# Patient Record
Sex: Female | Born: 1965 | Race: White | Hispanic: No | Marital: Single | Smoking: Former smoker
Health system: Southern US, Community
[De-identification: ages and names within clinical notes are randomized; demographics above are authoritative.]

## PROBLEM LIST (undated history)

## (undated) DIAGNOSIS — J45909 Unspecified asthma, uncomplicated: Secondary | ICD-10-CM

## (undated) DIAGNOSIS — E785 Hyperlipidemia, unspecified: Secondary | ICD-10-CM

## (undated) DIAGNOSIS — G4733 Obstructive sleep apnea (adult) (pediatric): Secondary | ICD-10-CM

## (undated) DIAGNOSIS — E119 Type 2 diabetes mellitus without complications: Secondary | ICD-10-CM

## (undated) DIAGNOSIS — N189 Chronic kidney disease, unspecified: Secondary | ICD-10-CM

## (undated) HISTORY — DX: Hyperlipidemia, unspecified: E78.5

## (undated) HISTORY — DX: Obstructive sleep apnea (adult) (pediatric): G47.33

## (undated) HISTORY — DX: Type 2 diabetes mellitus without complications: E11.9

## (undated) HISTORY — DX: Chronic kidney disease, unspecified: N18.9

## (undated) HISTORY — DX: Unspecified asthma, uncomplicated: J45.909

---

## 2020-04-27 DIAGNOSIS — E785 Hyperlipidemia, unspecified: Secondary | ICD-10-CM | POA: Diagnosis not present

## 2020-04-27 DIAGNOSIS — R600 Localized edema: Secondary | ICD-10-CM | POA: Diagnosis not present

## 2020-04-27 DIAGNOSIS — E1165 Type 2 diabetes mellitus with hyperglycemia: Secondary | ICD-10-CM | POA: Diagnosis not present

## 2020-04-27 DIAGNOSIS — I10 Essential (primary) hypertension: Secondary | ICD-10-CM | POA: Diagnosis not present

## 2020-05-08 DIAGNOSIS — M25561 Pain in right knee: Secondary | ICD-10-CM | POA: Diagnosis not present

## 2020-05-08 DIAGNOSIS — M25562 Pain in left knee: Secondary | ICD-10-CM | POA: Diagnosis not present

## 2020-05-08 DIAGNOSIS — G8929 Other chronic pain: Secondary | ICD-10-CM | POA: Diagnosis not present

## 2020-05-29 DIAGNOSIS — R600 Localized edema: Secondary | ICD-10-CM | POA: Diagnosis not present

## 2020-05-29 DIAGNOSIS — E1165 Type 2 diabetes mellitus with hyperglycemia: Secondary | ICD-10-CM | POA: Diagnosis not present

## 2020-05-29 DIAGNOSIS — E785 Hyperlipidemia, unspecified: Secondary | ICD-10-CM | POA: Diagnosis not present

## 2020-05-29 DIAGNOSIS — I10 Essential (primary) hypertension: Secondary | ICD-10-CM | POA: Diagnosis not present

## 2020-05-29 DIAGNOSIS — N1832 Chronic kidney disease, stage 3b: Secondary | ICD-10-CM | POA: Diagnosis not present

## 2020-05-29 DIAGNOSIS — R109 Unspecified abdominal pain: Secondary | ICD-10-CM | POA: Diagnosis not present

## 2020-06-05 DIAGNOSIS — G8929 Other chronic pain: Secondary | ICD-10-CM | POA: Diagnosis not present

## 2020-06-05 DIAGNOSIS — M25561 Pain in right knee: Secondary | ICD-10-CM | POA: Diagnosis not present

## 2020-06-05 DIAGNOSIS — M25562 Pain in left knee: Secondary | ICD-10-CM | POA: Diagnosis not present

## 2020-06-19 ENCOUNTER — Encounter: Payer: Self-pay | Admitting: Endocrinology

## 2020-06-19 ENCOUNTER — Other Ambulatory Visit: Payer: Self-pay

## 2020-06-19 ENCOUNTER — Ambulatory Visit (INDEPENDENT_AMBULATORY_CARE_PROVIDER_SITE_OTHER): Payer: BC Managed Care – PPO | Admitting: Endocrinology

## 2020-06-19 VITALS — BP 136/62 | HR 74 | Ht 66.5 in | Wt 342.6 lb

## 2020-06-19 DIAGNOSIS — N184 Chronic kidney disease, stage 4 (severe): Secondary | ICD-10-CM | POA: Diagnosis not present

## 2020-06-19 DIAGNOSIS — E1122 Type 2 diabetes mellitus with diabetic chronic kidney disease: Secondary | ICD-10-CM | POA: Diagnosis not present

## 2020-06-19 DIAGNOSIS — E1369 Other specified diabetes mellitus with other specified complication: Secondary | ICD-10-CM

## 2020-06-19 LAB — POCT GLYCOSYLATED HEMOGLOBIN (HGB A1C): Hemoglobin A1C: 5.8 % — AB (ref 4.0–5.6)

## 2020-06-19 MED ORDER — GLIMEPIRIDE 4 MG PO TABS: 4.0000 mg | ORAL_TABLET | Freq: Every day | ORAL | 3 refills | Status: DC

## 2020-06-19 NOTE — Progress Notes (Signed)
Subjective:    Patient ID: Laura Kennedy, female    DOB: 1965-10-25, 55 y.o.   MRN: [id number]  HPI pt is referred by Benjiman Core, PA, for diabetes.  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  She takes [place] 25/d, and glimepiride 4-BID.  She cannot afford to continue [place].  She says cbg varies from 45-500.  It is in general highest when she misses meds.   No past medical history on file.   Social History   Socioeconomic History  . Marital status: Single    Spouse name: Not on file  . Number of children: Not on file  . Years of education: Not on file  . Highest education level: Not on file  Occupational History  . Not on file  Tobacco Use  . Smoking status: Not on file  . Smokeless tobacco: Not on file  Substance and Sexual Activity  . Alcohol use: Not on file  . Drug use: Not on file  . Sexual activity: Not on file  Other Topics Concern  . Not on file  Social History Narrative  . Not on file   Social Determinants of Health   Financial Resource Strain: Not on file  Food Insecurity: Not on file  Transportation Needs: Not on file  Physical Activity: Not on file  Stress: Not on file  Social Connections: Not on file  Intimate Partner Violence: Not on file    No current outpatient medications on file prior to visit.   No current facility-administered medications on file prior to visit.    Not on File  Family History  Problem Relation Age of Onset  . Diabetes Neg Hx     BP 136/62 (BP Location: Right Arm, Patient Position: Sitting, Cuff Size: Large)   Pulse 74   Ht 5' 6.5" (1.689 m)   Wt (!) 342 lb 9.6 oz (155.4 kg)   SpO2 93%   BMI 54.47 kg/m     Review of Systems denies blurry vision, chest pain, n/v, urinary frequency, and depression. She has  lost weight--intentional.  She has doe.      Objective:   Physical Exam VITAL SIGNS:  See vs page GENERAL: no distress Pulses: dorsalis pedis intact bilat.   MSK: no deformity of the feet CV: 2+ bilat leg edema Skin:  no ulcer on the feet.  normal color and temp on the feet. Neuro: sensation is intact to touch on the feet, but decreased from normal.    Lab Results  Component Value Date   HGBA1C 5.8 (A) 06/19/2020   outside test results are reviewed:  Creat=1.7  I have reviewed outside records, and summarized: Pt was noted to have elevated A1c, and referred here.  Dyslipidemia, arthralgias, COPD, and HTN were also addressed.       Assessment & Plan:  Type 2 DM, with stage 4 CRI: overcontrolled SDOH: she cannot afford Januvia  Patient Instructions  good diet and exercise significantly improve the control of your diabetes.  please let me know if you wish to be referred to a dietician.  high blood sugar is very risky to your health.  you should see an eye doctor and dentist every year.  It is very important to get all recommended vaccinations.  Controlling  your blood pressure and cholesterol drastically reduces the damage diabetes does to your body.  Those who smoke should quit.  Please discuss these with your doctor.  check your blood sugar once a day.  vary the time of day when you check, between before the 3 meals, and at bedtime.  also check if you have symptoms of your blood sugar being too high or too low.  please keep a record of the readings and bring it to your next appointment here (or you can bring the meter itself).  You can write it on any piece of paper.  please call us sooner if your blood sugar goes below 70, or if most of your readings are over 200. We will need to take this complex situation in stages.  For now, please:  stop taking the Januvia, and:  Reduce the glimepiride to 4 mg in the morning, and none in the evening.   Please come back for a follow-up appointment  in 2 months.

## 2020-06-19 NOTE — Patient Instructions (Addendum)
good diet and exercise significantly improve the control of your diabetes.  please let me know if you wish to be referred to a dietician.  high blood sugar is very risky to your health.  you should see an eye doctor and dentist every year.  It is very important to get all recommended vaccinations.  Controlling your blood pressure and cholesterol drastically reduces the damage diabetes does to your body.  Those who smoke should quit.  Please discuss these with your doctor.  check your blood sugar once a day.  vary the time of day when you check, between before the 3 meals, and at bedtime.  also check if you have symptoms of your blood sugar being too high or too low.  please keep a record of the readings and bring it to your next appointment here (or you can bring the meter itself).  You can write it on any piece of paper.  please call us sooner if your blood sugar goes below 70, or if most of your readings are over 200. We will need to take this complex situation in stages.  For now, please:  stop taking the Januvia, and:  Reduce the glimepiride to 4 mg in the morning, and none in the evening.   Please come back for a follow-up appointment in 2 months.

## 2020-06-20 DIAGNOSIS — E119 Type 2 diabetes mellitus without complications: Secondary | ICD-10-CM | POA: Insufficient documentation

## 2020-06-20 DIAGNOSIS — E1165 Type 2 diabetes mellitus with hyperglycemia: Secondary | ICD-10-CM | POA: Insufficient documentation

## 2020-06-24 DIAGNOSIS — N2581 Secondary hyperparathyroidism of renal origin: Secondary | ICD-10-CM | POA: Diagnosis not present

## 2020-06-24 DIAGNOSIS — I129 Hypertensive chronic kidney disease with stage 1 through stage 4 chronic kidney disease, or unspecified chronic kidney disease: Secondary | ICD-10-CM | POA: Diagnosis not present

## 2020-06-24 DIAGNOSIS — N184 Chronic kidney disease, stage 4 (severe): Secondary | ICD-10-CM | POA: Diagnosis not present

## 2020-06-24 DIAGNOSIS — D631 Anemia in chronic kidney disease: Secondary | ICD-10-CM | POA: Diagnosis not present

## 2020-06-25 ENCOUNTER — Other Ambulatory Visit: Payer: Self-pay | Admitting: Nephrology

## 2020-06-25 DIAGNOSIS — N184 Chronic kidney disease, stage 4 (severe): Secondary | ICD-10-CM

## 2020-07-10 ENCOUNTER — Ambulatory Visit
Admission: RE | Admit: 2020-07-10 | Discharge: 2020-07-10 | Disposition: A | Discharge status: Home / Self Care | Payer: BC Managed Care – PPO | Source: Ambulatory Visit | Attending: Nephrology | Admitting: Nephrology

## 2020-07-10 ENCOUNTER — Other Ambulatory Visit: Payer: Self-pay

## 2020-07-10 DIAGNOSIS — N189 Chronic kidney disease, unspecified: Secondary | ICD-10-CM | POA: Diagnosis not present

## 2020-07-10 DIAGNOSIS — R222 Localized swelling, mass and lump, trunk: Secondary | ICD-10-CM | POA: Diagnosis not present

## 2020-07-10 DIAGNOSIS — N184 Chronic kidney disease, stage 4 (severe): Secondary | ICD-10-CM

## 2020-07-10 DIAGNOSIS — K8020 Calculus of gallbladder without cholecystitis without obstruction: Secondary | ICD-10-CM | POA: Diagnosis not present

## 2020-07-10 DIAGNOSIS — K8080 Other cholelithiasis without obstruction: Secondary | ICD-10-CM | POA: Diagnosis not present

## 2020-07-17 DIAGNOSIS — E1165 Type 2 diabetes mellitus with hyperglycemia: Secondary | ICD-10-CM | POA: Diagnosis not present

## 2020-07-17 DIAGNOSIS — M25561 Pain in right knee: Secondary | ICD-10-CM | POA: Diagnosis not present

## 2020-07-17 DIAGNOSIS — I10 Essential (primary) hypertension: Secondary | ICD-10-CM | POA: Diagnosis not present

## 2020-07-17 DIAGNOSIS — M25562 Pain in left knee: Secondary | ICD-10-CM | POA: Diagnosis not present

## 2020-08-14 DIAGNOSIS — D631 Anemia in chronic kidney disease: Secondary | ICD-10-CM | POA: Diagnosis not present

## 2020-08-14 DIAGNOSIS — N2581 Secondary hyperparathyroidism of renal origin: Secondary | ICD-10-CM | POA: Diagnosis not present

## 2020-08-14 DIAGNOSIS — N183 Chronic kidney disease, stage 3 unspecified: Secondary | ICD-10-CM | POA: Diagnosis not present

## 2020-08-14 DIAGNOSIS — N184 Chronic kidney disease, stage 4 (severe): Secondary | ICD-10-CM | POA: Diagnosis not present

## 2020-08-14 DIAGNOSIS — I129 Hypertensive chronic kidney disease with stage 1 through stage 4 chronic kidney disease, or unspecified chronic kidney disease: Secondary | ICD-10-CM | POA: Diagnosis not present

## 2020-08-17 ENCOUNTER — Other Ambulatory Visit: Payer: Self-pay

## 2020-08-17 ENCOUNTER — Ambulatory Visit (INDEPENDENT_AMBULATORY_CARE_PROVIDER_SITE_OTHER): Payer: BC Managed Care – PPO | Admitting: Endocrinology

## 2020-08-17 VITALS — BP 130/76 | HR 83 | Ht 66.5 in | Wt 324.0 lb

## 2020-08-17 DIAGNOSIS — R609 Edema, unspecified: Secondary | ICD-10-CM | POA: Insufficient documentation

## 2020-08-17 DIAGNOSIS — E1122 Type 2 diabetes mellitus with diabetic chronic kidney disease: Secondary | ICD-10-CM

## 2020-08-17 DIAGNOSIS — N184 Chronic kidney disease, stage 4 (severe): Secondary | ICD-10-CM | POA: Diagnosis not present

## 2020-08-17 LAB — POCT GLYCOSYLATED HEMOGLOBIN (HGB A1C): Hemoglobin A1C: 6.9 % — AB (ref 4.0–5.6)

## 2020-08-17 LAB — BRAIN NATRIURETIC PEPTIDE: Pro B Natriuretic peptide (BNP): 57 pg/mL (ref 0.0–100.0)

## 2020-08-17 NOTE — Progress Notes (Signed)
Subjective:    Patient ID: Laura Kennedy, female    DOB: 11/20/1965, 55 y.o.   MRN: [id number]  HPI Pt returns for f/u of diabetes mellitus: DM type: 2 Dx'ed: 123XX123 Complications: PN and stage 4 CRI Therapy: glimepiride GDM: never (G1P0) DKA: never Severe hypoglycemia: never Pancreatitis: never Pancreatic imaging: never SDOH: she cannot afford name brand meds Other: she did not tolerate pioglitazone (edema); she has never been on insulin Interval history: Despite increasing glimepiride to 4-BID, cbg's are in the 200's.    Social History   Socioeconomic History  . Marital status: Single    Spouse name: Not on file  . Number of children: Not on file  . Years of education: Not on file  . Highest education level: Not on file  Occupational History  . Not on file  Tobacco Use  . Smoking status: Not on file  . Smokeless tobacco: Not on file  Substance and Sexual Activity  . Alcohol use: Not on file  . Drug use: Not on file  . Sexual activity: Not on file  Other Topics Concern  . Not on file  Social History Narrative  . Not on file   Social Determinants of Health   Financial Resource Strain: Not on file  Food Insecurity: Not on file  Transportation Needs: Not on file  Physical Activity: Not on file  Stress: Not on file  Social Connections: Not on file  Intimate Partner Violence: Not on file    Current Outpatient Medications on File Prior to Visit  Medication Sig Dispense Refill  . glimepiride (AMARYL) 4 MG tablet Take 1 tablet (4 mg total) by mouth daily before breakfast. 30 tablet 3   No current facility-administered medications on file prior to visit.    No Known Allergies  Family History  Problem Relation Age of Onset  . Diabetes Neg Hx     BP 130/76 (BP Location: Right Arm, Patient Position: Sitting, Cuff Size: Large)   Pulse 83   Ht 5' 6.5" (1.689 m)   Wt (!) 324 lb (147 kg)   SpO2 96%   BMI 51.51 kg/m   Review of Systems She has lost  weight--intentional.     Objective:   Physical Exam VITAL SIGNS:  See vs page GENERAL: no distress Pulses: dorsalis pedis intact bilat.   MSK: no deformity of the feet CV: 2+ bilat leg edema Skin:  no ulcer on the feet.  normal color and temp on the feet. Neuro: sensation is intact to touch on the feet, but decreased from normal. Ext: there is bilateral onychomycosis of the toenails.    A1c=6.9%     Assessment & Plan:  Type 2 DM: uncontrolled.  CRI: check fructosamine.      Patient Instructions   check your blood sugar once a day.  vary the time of day when you check, between before the 3 meals, and at bedtime.  also check if you have symptoms of your blood sugar being too high or too low.  please keep a record of the readings and bring it to your next appointment here (or you can bring the meter itself).  You can write it on any piece of paper.  please call us sooner if your blood sugar goes below 70, or if most of your readings are over 200. Blood tests are requested for you today.  We'll let you know about the results.  If this test is high, we can add colesevelam, acarbose, or bromocriptine.  Still, you may need insulin to get your a1c good We will need to take this complex situation in stages.   Please come back for a follow-up appointment in 2 months.     Fructosamine Test Why am I having this test? The fructosamine test is a blood test to measure your average blood sugar (glucose) level over a period of 2-3 weeks. You may have this test to help determine if recent changes in your diabetes medicines are working to control your glucose level. This test is similar to the more commonly used hemoglobin A1C test. What is being tested? This test checks the amount of fructosamine in your blood. Fructosamine is a substance that forms when glucose combines with protein in the blood. Your fructosamine level is a good indicator of your average blood glucose levels. What kind of sample  is taken? A blood sample is required for this test. It is usually collected by inserting a needle into a blood vessel.   Tell a health care provider about:  All medicines you are taking, including vitamins, herbs, eye drops, creams, and over-the-counter medicines.  Any medical conditions you have.  Whether you are pregnant or may be pregnant. How are the results reported? Your test results will be reported as a value that tells you how much fructosamine is in your blood. This will be given as millimoles of fructosamine per liter of blood (mmol/L). Your health care provider will compare your results to normal ranges that were established after testing a large group of people (reference ranges). Reference ranges may vary among labs and hospitals. For this test, a common reference range may be 205-285 mmol/L. What do the results mean? If your result is within the reference range, then your average blood glucose level is in the normal range. If your result is higher than the reference range, then your average blood glucose level is too high. This means that you and your health care provider may need to adjust your treatment plan. Talk with your health care provider about what your results mean. Questions to ask your health care provider Ask your health care provider, or the department that is doing the test:  When will my results be ready?  How will I get my results?  What are my treatment options?  What other tests do I need?  What are my next steps? Summary  Fructosamine is a substance that forms when glucose combines with protein in the blood.  The fructosamine test is a blood test to measure your average glucose level over a period of 2-3 weeks.  The fructosamine test is similar to the more commonly used hemoglobin A1C test.  A result higher than the reference range means that your average blood glucose level is too high. Talk with your health care provider about what your results  mean. This information is not intended to replace advice given to you by your health care provider. Make sure you discuss any questions you have with your health care provider. Document Revised: 12/11/2019 Document Reviewed: 12/11/2019 Elsevier Patient Education  2021 [employer].

## 2020-08-17 NOTE — Patient Instructions (Addendum)
check your blood sugar once a day.  vary the time of day when you check, between before the 3 meals, and at bedtime.  also check if you have symptoms of your blood sugar being too high or too low.  please keep a record of the readings and bring it to your next appointment here (or you can bring the meter itself).  You can write it on any piece of paper.  please call us sooner if your blood sugar goes below 70, or if most of your readings are over 200. Blood tests are requested for you today.  We'll let you know about the results.  If this test is high, we can add colesevelam, acarbose, or bromocriptine.  Still, you may need insulin to get your a1c good We will need to take this complex situation in stages.   Please come back for a follow-up appointment in 2 months.     Fructosamine Test Why am I having this test? The fructosamine test is a blood test to measure your average blood sugar (glucose) level over a period of 2-3 weeks. You may have this test to help determine if recent changes in your diabetes medicines are working to control your glucose level. This test is similar to the more commonly used hemoglobin A1C test. What is being tested? This test checks the amount of fructosamine in your blood. Fructosamine is a substance that forms when glucose combines with protein in the blood. Your fructosamine level is a good indicator of your average blood glucose levels. What kind of sample is taken? A blood sample is required for this test. It is usually collected by inserting a needle into a blood vessel.   Tell a health care provider about:  All medicines you are taking, including vitamins, herbs, eye drops, creams, and over-the-counter medicines.  Any medical conditions you have.  Whether you are pregnant or may be pregnant. How are the results reported? Your test results will be reported as a value that tells you how much fructosamine is in your blood. This will be given as millimoles of  fructosamine per liter of blood (mmol/L). Your health care provider will compare your results to normal ranges that were established after testing a large group of people (reference ranges). Reference ranges may vary among labs and hospitals. For this test, a common reference range may be 205-285 mmol/L. What do the results mean? If your result is within the reference range, then your average blood glucose level is in the normal range. If your result is higher than the reference range, then your average blood glucose level is too high. This means that you and your health care provider may need to adjust your treatment plan. Talk with your health care provider about what your results mean. Questions to ask your health care provider Ask your health care provider, or the department that is doing the test:  When will my results be ready?  How will I get my results?  What are my treatment options?  What other tests do I need?  What are my next steps? Summary  Fructosamine is a substance that forms when glucose combines with protein in the blood.  The fructosamine test is a blood test to measure your average glucose level over a period of 2-3 weeks.  The fructosamine test is similar to the more commonly used hemoglobin A1C test.  A result higher than the reference range means that your average blood glucose level is too high. Talk with  your health care provider about what your results mean. This information is not intended to replace advice given to you by your health care provider. Make sure you discuss any questions you have with your health care provider. Document Revised: 12/11/2019 Document Reviewed: 12/11/2019 Elsevier Patient Education  2021 [employer].

## 2020-08-18 ENCOUNTER — Other Ambulatory Visit: Payer: Self-pay | Admitting: Nephrology

## 2020-08-18 DIAGNOSIS — N183 Chronic kidney disease, stage 3 unspecified: Secondary | ICD-10-CM

## 2020-08-18 LAB — BASIC METABOLIC PANEL
BUN: 22 mg/dL (ref 6–23)
CO2: 24 mEq/L (ref 19–32)
Calcium: 9.4 mg/dL (ref 8.4–10.5)
Chloride: 102 mEq/L (ref 96–112)
Creatinine, Ser: 1.23 mg/dL — ABNORMAL HIGH (ref 0.40–1.20)
GFR: 49.25 mL/min — ABNORMAL LOW (ref >60.00)
Glucose, Bld: 220 mg/dL — ABNORMAL HIGH (ref 70–99)
Potassium: 4 mEq/L (ref 3.5–5.1)
Sodium: 140 mEq/L (ref 135–145)

## 2020-08-19 LAB — FRUCTOSAMINE: Fructosamine: 271 umol/L (ref 205–285)

## 2020-09-02 ENCOUNTER — Ambulatory Visit
Admission: RE | Admit: 2020-09-02 | Discharge: 2020-09-02 | Disposition: A | Discharge status: Home / Self Care | Payer: BC Managed Care – PPO | Source: Ambulatory Visit | Attending: Nephrology | Admitting: Nephrology

## 2020-09-02 ENCOUNTER — Other Ambulatory Visit: Payer: Self-pay

## 2020-09-02 DIAGNOSIS — K8020 Calculus of gallbladder without cholecystitis without obstruction: Secondary | ICD-10-CM | POA: Diagnosis not present

## 2020-09-02 DIAGNOSIS — K760 Fatty (change of) liver, not elsewhere classified: Secondary | ICD-10-CM | POA: Diagnosis not present

## 2020-09-02 DIAGNOSIS — N2889 Other specified disorders of kidney and ureter: Secondary | ICD-10-CM | POA: Diagnosis not present

## 2020-09-02 DIAGNOSIS — N183 Chronic kidney disease, stage 3 unspecified: Secondary | ICD-10-CM

## 2020-09-02 MED ORDER — GADOBENATE DIMEGLUMINE 529 MG/ML IV SOLN
20.0000 mL | Freq: Once | INTRAVENOUS | Status: AC | PRN
  Administered 2020-09-02: 20 mL via INTRAVENOUS

## 2020-10-21 ENCOUNTER — Ambulatory Visit
Admission: RE | Admit: 2020-10-21 | Discharge: 2020-10-21 | Disposition: A | Discharge status: Home / Self Care | Payer: BC Managed Care – PPO | Source: Ambulatory Visit | Attending: Sports Medicine | Admitting: Sports Medicine

## 2020-10-21 ENCOUNTER — Other Ambulatory Visit: Payer: Self-pay | Admitting: Sports Medicine

## 2020-10-21 DIAGNOSIS — M25562 Pain in left knee: Secondary | ICD-10-CM

## 2020-10-21 DIAGNOSIS — M25561 Pain in right knee: Secondary | ICD-10-CM | POA: Diagnosis not present

## 2020-11-09 DIAGNOSIS — M170 Bilateral primary osteoarthritis of knee: Secondary | ICD-10-CM | POA: Diagnosis not present

## 2020-11-20 ENCOUNTER — Ambulatory Visit: Payer: BC Managed Care – PPO | Admitting: Endocrinology

## 2020-12-16 DIAGNOSIS — I129 Hypertensive chronic kidney disease with stage 1 through stage 4 chronic kidney disease, or unspecified chronic kidney disease: Secondary | ICD-10-CM | POA: Diagnosis not present

## 2020-12-16 DIAGNOSIS — D631 Anemia in chronic kidney disease: Secondary | ICD-10-CM | POA: Diagnosis not present

## 2020-12-16 DIAGNOSIS — N183 Chronic kidney disease, stage 3 unspecified: Secondary | ICD-10-CM | POA: Diagnosis not present

## 2020-12-16 DIAGNOSIS — R109 Unspecified abdominal pain: Secondary | ICD-10-CM | POA: Diagnosis not present

## 2021-01-22 DIAGNOSIS — E1122 Type 2 diabetes mellitus with diabetic chronic kidney disease: Secondary | ICD-10-CM | POA: Diagnosis not present

## 2021-01-22 DIAGNOSIS — I10 Essential (primary) hypertension: Secondary | ICD-10-CM | POA: Diagnosis not present

## 2021-01-22 DIAGNOSIS — J449 Chronic obstructive pulmonary disease, unspecified: Secondary | ICD-10-CM | POA: Diagnosis not present

## 2021-01-22 DIAGNOSIS — E785 Hyperlipidemia, unspecified: Secondary | ICD-10-CM | POA: Diagnosis not present

## 2021-01-27 ENCOUNTER — Other Ambulatory Visit: Payer: Self-pay

## 2021-01-27 ENCOUNTER — Ambulatory Visit (INDEPENDENT_AMBULATORY_CARE_PROVIDER_SITE_OTHER): Payer: BC Managed Care – PPO | Admitting: Endocrinology

## 2021-01-27 VITALS — BP 140/80 | HR 81 | Ht 66.5 in | Wt 312.2 lb

## 2021-01-27 DIAGNOSIS — E1122 Type 2 diabetes mellitus with diabetic chronic kidney disease: Secondary | ICD-10-CM | POA: Diagnosis not present

## 2021-01-27 DIAGNOSIS — N184 Chronic kidney disease, stage 4 (severe): Secondary | ICD-10-CM

## 2021-01-27 DIAGNOSIS — M170 Bilateral primary osteoarthritis of knee: Secondary | ICD-10-CM | POA: Diagnosis not present

## 2021-01-27 MED ORDER — RYBELSUS 3 MG PO TABS: 3.0000 mg | ORAL_TABLET | Freq: Every day | ORAL | 11 refills | Status: DC

## 2021-01-27 NOTE — Patient Instructions (Signed)
check your blood sugar once a day.  vary the time of day when you check, between before the 3 meals, and at bedtime.  also check if you have symptoms of your blood sugar being too high or too low.  please keep a record of the readings and bring it to your next appointment here (or you can bring the meter itself).  You can write it on any piece of paper.  please call us sooner if your blood sugar goes below 70, or if most of your readings are over 200.  I have sent a prescription to your pharmacy, to add Rybelsus.   Please continue the same glimepiride. Please call or message [place] next week, to tell us how the blood sugar is doing.  I hope we can then increase the Rybelsus.    Please come back for a follow-up appointment in January.

## 2021-01-27 NOTE — Progress Notes (Signed)
   Subjective:    Patient ID: Laura Kennedy, female    DOB: 1966-03-20, 55 y.o.   MRN: [id number]  HPI Pt returns for f/u of diabetes mellitus: DM type: 2 Dx'ed: 6440 Complications: PN and stage 4 CRI Therapy: glimepiride GDM: never (G1P0) DKA: never Severe hypoglycemia: never Pancreatitis: never Pancreatic imaging: never SDOH: she prefers generic rx; she uses Reli-on meter, as it is cheaper than insurance.  Other: she did not tolerate pioglitazone (edema); she has never been on insulin; fructosamine has confirmed A1c.   Interval history: she has not recently checked cbg.  No recent steroids.    Social History   Socioeconomic History   Marital status: Single    Spouse name: Not on file   Number of children: Not on file   Years of education: Not on file   Highest education level: Not on file  Occupational History   Not on file  Tobacco Use   Smoking status: Not on file   Smokeless tobacco: Not on file  Substance and Sexual Activity   Alcohol use: Not on file   Drug use: Not on file   Sexual activity: Not on file  Other Topics Concern   Not on file  Social History Narrative   Not on file   Social Determinants of Health   Financial Resource Strain: Not on file  Food Insecurity: Not on file  Transportation Needs: Not on file  Physical Activity: Not on file  Stress: Not on file  Social Connections: Not on file  Intimate Partner Violence: Not on file    Current Outpatient Medications on File Prior to Visit  Medication Sig Dispense Refill   glimepiride (AMARYL) 4 MG tablet Take 1 tablet (4 mg total) by mouth daily before breakfast. 30 tablet 3   No current facility-administered medications on file prior to visit.    No Known Allergies  Family History  Problem Relation Age of Onset   Diabetes Neg Hx     BP 140/80 (BP Location: Right Arm, Patient Position: Sitting, Cuff Size: Large)   Pulse 81   Ht 5' 6.5" (1.689 m)   Wt (!) 312 lb 3.2 oz (141.6 kg)   SpO2 96%    BMI 49.64 kg/m    Review of Systems GERD is well-controlled on Nexium.      Objective:   Physical Exam    outside test results are reviewed: A1c=10.6%    Assessment & Plan:  Type 2 DM: uncontrolled  Patient Instructions  check your blood sugar once a day.  vary the time of day when you check, between before the 3 meals, and at bedtime.  also check if you have symptoms of your blood sugar being too high or too low.  please keep a record of the readings and bring it to your next appointment here (or you can bring the meter itself).  You can write it on any piece of paper.  please call us sooner if your blood sugar goes below 70, or if most of your readings are over 200.  I have sent a prescription to your pharmacy, to add Rybelsus.   Please continue the same glimepiride. Please call or message [place] next week, to tell us how the blood sugar is doing.  I hope we can then increase the Rybelsus.    Please come back for a follow-up appointment in January.

## 2021-02-02 ENCOUNTER — Telehealth: Payer: Self-pay | Admitting: Endocrinology

## 2021-02-02 ENCOUNTER — Other Ambulatory Visit: Payer: Self-pay | Admitting: Endocrinology

## 2021-02-02 MED ORDER — RYBELSUS 7 MG PO TABS: 7.0000 mg | ORAL_TABLET | Freq: Every day | ORAL | 3 refills | Status: DC

## 2021-02-02 NOTE — Telephone Encounter (Signed)
Message sent thru MyChart 

## 2021-02-02 NOTE — Telephone Encounter (Signed)
Pt giving an update that she's tolerating Rybelsus. Her sugars are trending down from 300s to 200s. She's interested in going up to 7mg s. Will do 2-3mg s with the prescription she has now instead of wasting them, if that's okay.  Pt contact [phone]

## 2021-04-21 ENCOUNTER — Other Ambulatory Visit: Payer: Self-pay

## 2021-04-21 ENCOUNTER — Ambulatory Visit (INDEPENDENT_AMBULATORY_CARE_PROVIDER_SITE_OTHER): Payer: BC Managed Care – PPO | Admitting: Endocrinology

## 2021-04-21 VITALS — BP 124/78 | HR 98 | Ht 66.5 in | Wt 311.6 lb

## 2021-04-21 DIAGNOSIS — E1122 Type 2 diabetes mellitus with diabetic chronic kidney disease: Secondary | ICD-10-CM | POA: Diagnosis not present

## 2021-04-21 DIAGNOSIS — N184 Chronic kidney disease, stage 4 (severe): Secondary | ICD-10-CM | POA: Diagnosis not present

## 2021-04-21 LAB — POCT GLYCOSYLATED HEMOGLOBIN (HGB A1C): Hemoglobin A1C: 8 % — AB (ref 4.0–5.6)

## 2021-04-21 MED ORDER — OZEMPIC (2 MG/DOSE) 8 MG/3ML ~~LOC~~ SOPN: 2.0000 mg | PEN_INJECTOR | SUBCUTANEOUS | 3 refills | Status: DC

## 2021-04-21 NOTE — Patient Instructions (Addendum)
check your blood sugar once a day.  vary the time of day when you check, between before the 3 meals, and at bedtime.  also check if you have symptoms of your blood sugar being too high or too low.  please keep a record of the readings and bring it to your next appointment here (or you can bring the meter itself).  You can write it on any piece of paper.  please call us sooner if your blood sugar goes below 70, or if most of your readings are over 200.   I have sent a prescription to your pharmacy, to change Rybelsus to [place].   If the insurance declines this, we'll change to Victoza.   Please continue the same glimepiride.     Please come back for a follow-up appointment in 2 months.

## 2021-04-21 NOTE — Progress Notes (Signed)
° °  Subjective:    Patient ID: Laura Kennedy, female    DOB: 1966-02-23, 56 y.o.   MRN: [id number]  HPI Pt returns for f/u of diabetes mellitus:  DM type: 2 Dx'ed: 5945 Complications: PN and stage 4 CRI Therapy: glimepiride GDM: never (G1P0) DKA: never Severe hypoglycemia: never Pancreatitis: never Pancreatic imaging: never SDOH: she uses Reli-on meter, as it is cheaper than insurance;  Other: she did not tolerate pioglitazone (edema); she has never been on insulin; fructosamine has confirmed A1c.   Interval history: next steroid injection (left knee) is next week.  Ins no longer covers Rybelsus.  Pt says cbg varies from 120-250.    Social History   Socioeconomic History   Marital status: Single    Spouse name: Not on file   Number of children: Not on file   Years of education: Not on file   Highest education level: Not on file  Occupational History   Not on file  Tobacco Use   Smoking status: Not on file   Smokeless tobacco: Not on file  Substance and Sexual Activity   Alcohol use: Not on file   Drug use: Not on file   Sexual activity: Not on file  Other Topics Concern   Not on file  Social History Narrative   Not on file   Social Determinants of Health   Financial Resource Strain: Not on file  Food Insecurity: Not on file  Transportation Needs: Not on file  Physical Activity: Not on file  Stress: Not on file  Social Connections: Not on file  Intimate Partner Violence: Not on file    Current Outpatient Medications on File Prior to Visit  Medication Sig Dispense Refill   glimepiride (AMARYL) 4 MG tablet Take 1 tablet (4 mg total) by mouth daily before breakfast. 30 tablet 3   No current facility-administered medications on file prior to visit.    No Known Allergies  Family History  Problem Relation Age of Onset   Diabetes Neg Hx     BP 124/78    Pulse 98    Ht 5' 6.5" (1.689 m)    Wt (!) 311 lb 9.6 oz (141.3 kg)    SpO2 96%    BMI 49.54 kg/m   Review of  Systems She denies hypoglycemia/N/V.  GERD is well-controlled on OTC Nexium.      Objective:   Physical Exam    Lab Results  Component Value Date   HGBA1C 8.0 (A) 04/21/2021      Assessment & Plan:  Type 2 DM: uncontrolled  Patient Instructions  check your blood sugar once a day.  vary the time of day when you check, between before the 3 meals, and at bedtime.  also check if you have symptoms of your blood sugar being too high or too low.  please keep a record of the readings and bring it to your next appointment here (or you can bring the meter itself).  You can write it on any piece of paper.  please call us sooner if your blood sugar goes below 70, or if most of your readings are over 200.   I have sent a prescription to your pharmacy, to change Rybelsus to [place].   If the insurance declines this, we'll change to Victoza.   Please continue the same glimepiride.     Please come back for a follow-up appointment in 2 months.

## 2021-04-28 DIAGNOSIS — M170 Bilateral primary osteoarthritis of knee: Secondary | ICD-10-CM | POA: Diagnosis not present

## 2021-04-29 ENCOUNTER — Other Ambulatory Visit: Payer: Self-pay

## 2021-04-29 ENCOUNTER — Ambulatory Visit
Admission: RE | Admit: 2021-04-29 | Discharge: 2021-04-29 | Disposition: A | Discharge status: Home / Self Care | Payer: BC Managed Care – PPO | Source: Ambulatory Visit | Attending: Family Medicine | Admitting: Family Medicine

## 2021-04-29 ENCOUNTER — Other Ambulatory Visit: Payer: Self-pay | Admitting: Family Medicine

## 2021-04-29 DIAGNOSIS — Z111 Encounter for screening for respiratory tuberculosis: Secondary | ICD-10-CM | POA: Diagnosis not present

## 2021-04-29 DIAGNOSIS — H2511 Age-related nuclear cataract, right eye: Secondary | ICD-10-CM | POA: Diagnosis not present

## 2021-04-29 DIAGNOSIS — H5213 Myopia, bilateral: Secondary | ICD-10-CM | POA: Diagnosis not present

## 2021-04-29 DIAGNOSIS — E119 Type 2 diabetes mellitus without complications: Secondary | ICD-10-CM | POA: Diagnosis not present

## 2021-05-05 DIAGNOSIS — E785 Hyperlipidemia, unspecified: Secondary | ICD-10-CM | POA: Diagnosis not present

## 2021-05-19 DIAGNOSIS — E1165 Type 2 diabetes mellitus with hyperglycemia: Secondary | ICD-10-CM | POA: Diagnosis not present

## 2021-05-19 DIAGNOSIS — Z6841 Body Mass Index (BMI) 40.0 and over, adult: Secondary | ICD-10-CM | POA: Diagnosis not present

## 2021-06-11 DIAGNOSIS — M25562 Pain in left knee: Secondary | ICD-10-CM | POA: Diagnosis not present

## 2021-06-18 DIAGNOSIS — M25562 Pain in left knee: Secondary | ICD-10-CM | POA: Diagnosis not present

## 2021-06-23 ENCOUNTER — Ambulatory Visit (INDEPENDENT_AMBULATORY_CARE_PROVIDER_SITE_OTHER): Payer: BC Managed Care – PPO | Admitting: Endocrinology

## 2021-06-23 VITALS — BP 114/76 | HR 83 | Ht 66.5 in | Wt 298.6 lb

## 2021-06-23 DIAGNOSIS — E1122 Type 2 diabetes mellitus with diabetic chronic kidney disease: Secondary | ICD-10-CM | POA: Diagnosis not present

## 2021-06-23 DIAGNOSIS — N183 Chronic kidney disease, stage 3 unspecified: Secondary | ICD-10-CM | POA: Diagnosis not present

## 2021-06-23 DIAGNOSIS — N184 Chronic kidney disease, stage 4 (severe): Secondary | ICD-10-CM | POA: Diagnosis not present

## 2021-06-23 DIAGNOSIS — N2581 Secondary hyperparathyroidism of renal origin: Secondary | ICD-10-CM | POA: Diagnosis not present

## 2021-06-23 DIAGNOSIS — I129 Hypertensive chronic kidney disease with stage 1 through stage 4 chronic kidney disease, or unspecified chronic kidney disease: Secondary | ICD-10-CM | POA: Diagnosis not present

## 2021-06-23 DIAGNOSIS — D631 Anemia in chronic kidney disease: Secondary | ICD-10-CM | POA: Diagnosis not present

## 2021-06-23 LAB — POCT GLYCOSYLATED HEMOGLOBIN (HGB A1C): Hemoglobin A1C: 6.5 % — AB (ref 4.0–5.6)

## 2021-06-23 MED ORDER — OZEMPIC (2 MG/DOSE) 8 MG/3ML ~~LOC~~ SOPN: 2.0000 mg | PEN_INJECTOR | SUBCUTANEOUS | 3 refills | Status: DC

## 2021-06-23 MED ORDER — GLIMEPIRIDE 2 MG PO TABS: 2.0000 mg | ORAL_TABLET | Freq: Every day | ORAL | 3 refills | Status: DC

## 2021-06-23 NOTE — Patient Instructions (Addendum)
check your blood sugar once a day.  vary the time of day when you check, between before the 3 meals, and at bedtime.  also check if you have symptoms of your blood sugar being too high or too low.  please keep a record of the readings and bring it to your next appointment here (or you can bring the meter itself).  You can write it on any piece of paper.  please call us sooner if your blood sugar goes below 70, or if most of your readings are over 200.    ?I have sent a prescription to your pharmacy, to half the glimepiride ?Please continue the same Ozempic.  ?Please come back for a follow-up appointment in 3 months.   ?

## 2021-06-23 NOTE — Progress Notes (Signed)
? ?  Subjective:  ? ? Patient ID: Laura Kennedy, female    DOB: Jun 10, 1965, 56 y.o.   MRN: [id number] ? ?HPI ?Pt returns for f/u of diabetes mellitus:  ?DM type: 2 ?Dx'ed: 2004 ?Complications: PN and stage 3 CRI.   ?Therapy: glimepiride and Ozempic.   ?GDM: never (G1P0) ?DKA: never ?Severe hypoglycemia: never ?Pancreatitis: never ?Pancreatic imaging: never ?SDOH: she uses Rel[employer] meter, as it is cheaper than insurance;  ?Other: she did not tolerate pioglitazone (edema); she has never been on insulin; fructosamine has confirmed A1c.   ?Interval history: pt says cbg varies from 96-240.  HB has recurred.   ? ?Social History  ? ?Socioeconomic History  ? Marital status: Single  ?  Spouse name: Not on file  ? Number of children: Not on file  ? Years of education: Not on file  ? Highest education level: Not on file  ?Occupational History  ? Not on file  ?Tobacco Use  ? Smoking status: Not on file  ? Smokeless tobacco: Not on file  ?Substance and Sexual Activity  ? Alcohol use: Not on file  ? Drug use: Not on file  ? Sexual activity: Not on file  ?Other Topics Concern  ? Not on file  ?Social History Narrative  ? Not on file  ? ?Social Determinants of Health  ? ?Financial Resource Strain: Not on file  ?Food Insecurity: Not on file  ?Transportation Needs: Not on file  ?Physical Activity: Not on file  ?Stress: Not on file  ?Social Connections: Not on file  ?Intimate Partner Violence: Not on file  ? ? ?No current outpatient medications on file prior to visit.  ? ?No current facility-administered medications on file prior to visit.  ? ? ?No Known Allergies ? ?Family History  ?Problem Relation Age of Onset  ? Diabetes Neg Hx   ? ? ?BP 114/76 (BP Location: Left Arm, Patient Position: Sitting, Cuff Size: Normal)   Pulse 83   Ht 5' 6.5" (1.689 m)   Wt 298 lb 9.6 oz (135.4 kg)   SpO2 98%   BMI 47.47 kg/m?  ? ? ?Review of Systems ?She denies hypoglycemia ?   ?Objective:  ? Physical Exam ?VITAL SIGNS:  See vs page.   ?GENERAL: no  distress.  ? ? ?Lab Results  ?Component Value Date  ? CREATININE 1.23 (H) 08/17/2020  ? BUN 22 08/17/2020  ? NA 140 08/17/2020  ? K 4.0 08/17/2020  ? CL 102 08/17/2020  ? CO2 24 08/17/2020  ? ?A1c=6.5% ?   ?Assessment & Plan:  ?Type 2 DM: overcontrolled, for this SU-containing regimen.   ?HB, due to Ozempic: we discussed.  Pt wants to continue for now.  ? ?Patient Instructions  ?check your blood sugar once a day.  vary the time of day when you check, between before the 3 meals, and at bedtime.  also check if you have symptoms of your blood sugar being too high or too low.  please keep a record of the readings and bring it to your next appointment here (or you can bring the meter itself).  You can write it on any piece of paper.  please call us sooner if your blood sugar goes below 70, or if most of your readings are over 200.    ?I have sent a prescription to your pharmacy, to half the glimepiride ?Please continue the same Ozempic.  ?Please come back for a follow-up appointment in 3 months.   ? ? ?

## 2021-07-05 DIAGNOSIS — J189 Pneumonia, unspecified organism: Secondary | ICD-10-CM | POA: Diagnosis not present

## 2021-07-05 DIAGNOSIS — J019 Acute sinusitis, unspecified: Secondary | ICD-10-CM | POA: Diagnosis not present

## 2021-07-05 DIAGNOSIS — E668 Other obesity: Secondary | ICD-10-CM | POA: Diagnosis not present

## 2021-07-05 DIAGNOSIS — R059 Cough, unspecified: Secondary | ICD-10-CM | POA: Diagnosis not present

## 2021-07-16 DIAGNOSIS — J189 Pneumonia, unspecified organism: Secondary | ICD-10-CM | POA: Diagnosis not present

## 2021-07-16 DIAGNOSIS — R059 Cough, unspecified: Secondary | ICD-10-CM | POA: Diagnosis not present

## 2021-07-28 DIAGNOSIS — I10 Essential (primary) hypertension: Secondary | ICD-10-CM | POA: Diagnosis not present

## 2021-07-28 DIAGNOSIS — E1165 Type 2 diabetes mellitus with hyperglycemia: Secondary | ICD-10-CM | POA: Diagnosis not present

## 2021-07-28 DIAGNOSIS — E785 Hyperlipidemia, unspecified: Secondary | ICD-10-CM | POA: Diagnosis not present

## 2021-07-28 DIAGNOSIS — R1032 Left lower quadrant pain: Secondary | ICD-10-CM | POA: Diagnosis not present

## 2021-07-28 DIAGNOSIS — G8929 Other chronic pain: Secondary | ICD-10-CM | POA: Diagnosis not present

## 2021-08-06 ENCOUNTER — Ambulatory Visit (INDEPENDENT_AMBULATORY_CARE_PROVIDER_SITE_OTHER): Payer: BC Managed Care – PPO | Admitting: Pulmonary Disease

## 2021-08-06 ENCOUNTER — Encounter: Payer: Self-pay | Admitting: Pulmonary Disease

## 2021-08-06 ENCOUNTER — Ambulatory Visit (INDEPENDENT_AMBULATORY_CARE_PROVIDER_SITE_OTHER): Payer: BC Managed Care – PPO

## 2021-08-06 VITALS — BP 130/76 | HR 79 | Temp 98.1°F | Ht 66.5 in | Wt 307.8 lb

## 2021-08-06 DIAGNOSIS — J4550 Severe persistent asthma, uncomplicated: Secondary | ICD-10-CM

## 2021-08-06 DIAGNOSIS — J3081 Allergic rhinitis due to animal (cat) (dog) hair and dander: Secondary | ICD-10-CM | POA: Diagnosis not present

## 2021-08-06 DIAGNOSIS — G4730 Sleep apnea, unspecified: Secondary | ICD-10-CM

## 2021-08-06 DIAGNOSIS — G4733 Obstructive sleep apnea (adult) (pediatric): Secondary | ICD-10-CM | POA: Diagnosis not present

## 2021-08-06 DIAGNOSIS — J45909 Unspecified asthma, uncomplicated: Secondary | ICD-10-CM | POA: Diagnosis not present

## 2021-08-06 DIAGNOSIS — E669 Obesity, unspecified: Secondary | ICD-10-CM

## 2021-08-06 MED ORDER — AZELASTINE HCL 0.15 % NA SOLN: 1.0000 | Freq: Two times a day (BID) | NASAL | 5 refills | Status: DC

## 2021-08-06 MED ORDER — FLUTICASONE PROPIONATE 50 MCG/ACT NA SUSP: 1.0000 | Freq: Every day | NASAL | 2 refills | Status: DC

## 2021-08-06 MED ORDER — ALBUTEROL SULFATE HFA 108 (90 BASE) MCG/ACT IN AERS: 2.0000 | INHALATION_SPRAY | RESPIRATORY_TRACT | 3 refills | Status: AC | PRN

## 2021-08-06 MED ORDER — IPRATROPIUM-ALBUTEROL 0.5-2.5 (3) MG/3ML IN SOLN: 3.0000 mL | Freq: Four times a day (QID) | RESPIRATORY_TRACT | 5 refills | Status: AC | PRN

## 2021-08-06 NOTE — Progress Notes (Signed)
? ?[facility] ? ?Chief Complaint  ?Patient presents with  ? Consult  ?  Needs supplies   ? ? ?Past Surgical History:  ?She  has no past surgical history on file. ? ?Past Medical History:  ?DM type 2, GERD, HLD, HTN, CKD ? ?Constitutional:  ?BP 130/76 (BP Location: Left Arm, Patient Position: Sitting, Cuff Size: Large)   Pulse 79   Temp 98.1 ?F (36.7 ?C) (Oral)   Ht 5' 6.5" (1.689 m)   Wt (!) 307 lb 12.8 oz (139.6 kg)   SpO2 96%   BMI 48.94 kg/m?  ? ?Brief Summary:  ?Laura Kennedy is a 56 y.o. female with obstructive sleep apnea and allergic asthma. ?  ? ? ? ?Subjective:  ? ?She is from [place] and works as a Merchandiser, retail.  She moved to [place] in 2021 to be with her partner, but plans to move back to [place] in December 2023. ? ?She was diagnosed with sleep apnea years ago in [place].  She was told that she needed a doctors order to get new supplies.  She doesn't have a DME in [place].  No issues with mask fit or pressure settings.  She has been on ozempic and making good progress with weight loss. ? ?She has history of asthma and allergies.  Has bad reaction when around cats.  She was around a cat in April and had a flare.  Went to urgent care and treated with a zpak and prednisone.  Breathing better, but still has sinus congestion with post nasal drip.  This triggers a cough. ? ?Physical Exam:  ? ?Appearance - well kempt  ? ?ENMT - no sinus tenderness, no oral exudate, no LAN, Mallampati 4 airway, no stridor ? ?Respiratory - equal breath sounds bilaterally, no wheezing or rales ? ?CV - s1s2 regular rate and rhythm, no murmurs ? ?Ext - no clubbing, no edema ? ?Skin - no rashes ? ?Psych - normal mood and affect ?  ?Pulmonary testing:  ? ? ?Chest Imaging:  ? ? ?Sleep Tests:  ?Auto CPAP 07/06/21 to 08/04/21 >> used on 30 of 30 nights with average 6 hrs 14 min.  Average AHI 1.3 with median CPAP 6 and 95 th percentile CPAP 8 cm H2O ? ?Social History:  ?She  reports that she has  quit smoking. Her smoking use included cigarettes. She has a 52.50 pack-year smoking history. She has never used smokeless tobacco. ? ?Family History:  ?Her family history includes COPD in her father and sister; Cancer in her father; Clotting disorder in her mother; Heart disease in her mother. ?  ? ? ?Assessment/Plan:  ? ?Obstructive sleep apnea. ?- she is compliant with CPAP and reports benefit from therapy ?- will try to get replacement supplies from her DME in [place] ?- continue auto CPAP 5 to 15 cm H2O ? ?Allergic asthma. ?- continue symbicort and singulair ?- prn albuterol HFA, duoneb ?- if symptoms progress, then add LAMA ? ?Allergic rhinitis. ?- add nasal irrigation, flonase, astepro ?- continue singulair, zyrtec ? ?Obesity. ?- making progress with ozempic ? ?Time Spent Involved in Patient Care on Day of Examination:  ?50 minutes ? ?Follow up:  ? ?Patient Instructions  ?Will see if we can get replacement CPAP supplies set up from [place] ? ?Nasal irrigation nightly ? ?Flonase 1 spray in each nostril nightly ? ?Astepro 1 spray in each nostril in the morning and in the evening ? ?Continue symbicort two puffs twice per  day, montelukast 10 mg pill nightly, and zyrtec 10 mg pill nightly ? ?Albuterol or duoneb every 6 hours as needed for cough, wheeze, or chest congestion ? ?Chest xray to day ? ?Will arrange for pulmonary function test and follow up in 4 to 6 weeks ? ?Medication List:  ? ?Allergies as of 08/06/2021   ? ?   Reactions  ? Bee Venom Anaphylaxis  ? Fire [employer] Anaphylaxis  ? Penicillins Itching, Other (See Comments)  ? Sulfa Antibiotics Hives, Other (See Comments)  ? ?  ? ?  ?Medication List  ?  ? ?  ? Accurate as of Aug 06, 2021 11:51 AM. If you have any questions, ask your nurse or doctor.  ?  ?  ? ?  ? ?albuterol 108 (90 Base) MCG/ACT inhaler ?Commonly known as: VENTOLIN HFA ?  ?Azelastine HCl 0.15 % Soln ?Commonly known as: Astepro ?Place 1 spray into the nose 2 (two) times daily. ?Started by: Chesley Mires, MD ?  ?cetirizine 10 MG tablet ?Commonly known as: ZYRTEC ?1 tablet ?  ?cyclobenzaprine 10 MG tablet ?Commonly known as: FLEXERIL ?Take 10 mg by mouth every 8 (eight) hours as needed. ?  ?esomeprazole 20 MG capsule ?Commonly known as: Randall ?TAKE 1 CAPSULE BY MOUTH EVERY DAY FOR 30 DAYS ?  ?fluticasone 50 MCG/ACT nasal spray ?Commonly known as: FLONASE ?Place 1 spray into both nostrils daily. ?Started by: Chesley Mires, MD ?  ?furosemide 40 MG tablet ?Commonly known as: LASIX ?TAKE 1 TABLET BY MOUTH TWICE A DAY FOR 90 DAYS ?  ?glimepiride 2 MG tablet ?Commonly known as: AMARYL ?Take 1 tablet (2 mg total) by mouth daily before breakfast. ?  ?HYDROcodone-acetaminophen 7.5-325 MG tablet ?Commonly known as: NORCO ?1 tablet as needed ?  ?ipratropium-albuterol 0.5-2.5 (3) MG/3ML Soln ?Commonly known as: DUONEB ?Take 3 mLs by nebulization every 6 (six) hours as needed. ?Started by: Chesley Mires, MD ?  ?lovastatin 20 MG tablet ?Commonly known as: MEVACOR ?SMARTSIG:1 Tablet(s) By Mouth Every Evening ?  ?metoprolol tartrate 50 MG tablet ?Commonly known as: LOPRESSOR ?TAKE 1 TABLET BY MOUTH TWICE A DAY WITH FOOD FOR 90 DAYS ?  ?montelukast 10 MG tablet ?Commonly known as: SINGULAIR ?Take 10 mg by mouth daily. ?  ?Ozempic (2 MG/DOSE) 8 MG/3ML Sopn ?Generic drug: Semaglutide (2 MG/DOSE) ?Inject 2 mg into the skin once a week. ?  ?Symbicort 160-4.5 MCG/ACT inhaler ?Generic drug: budesonide-formoterol ?Inhale 2 puffs into the lungs 2 (two) times daily. ?  ?valsartan 160 MG tablet ?Commonly known as: DIOVAN ?Take by mouth. ?  ? ?  ? ? ?Signature:  ?Chesley Mires, MD ?[facility] ?Pager - [phone] ?08/06/2021, 11:51 AM ?  ? ? ? ? ? ? ? ? ?

## 2021-08-06 NOTE — Patient Instructions (Signed)
Will see if we can get replacement CPAP supplies set up from [place] ? ?Nasal irrigation nightly ? ?Flonase 1 spray in each nostril nightly ? ?Astepro 1 spray in each nostril in the morning and in the evening ? ?Continue symbicort two puffs twice per day, montelukast 10 mg pill nightly, and zyrtec 10 mg pill nightly ? ?Albuterol or duoneb every 6 hours as needed for cough, wheeze, or chest congestion ? ?Chest xray to day ? ?Will arrange for pulmonary function test and follow up in 4 to 6 weeks ?

## 2021-08-06 NOTE — Addendum Note (Signed)
Addended by: Gavin Potters R on: 08/06/2021 03:52 PM ? ? Modules accepted: Orders ? ?

## 2021-08-09 ENCOUNTER — Other Ambulatory Visit: Payer: Self-pay | Admitting: Pulmonary Disease

## 2021-08-09 NOTE — Telephone Encounter (Signed)
New script sent

## 2021-08-09 NOTE — Telephone Encounter (Signed)
Alternative Requested:PLEASE CHANGE THIS TO 0.1% WHICH IS COVERED AS SCRIPT 0.15% IS OTC. ? ?Dr. Halford Chessman please advise if change is ok.  ?

## 2021-08-18 DIAGNOSIS — G4733 Obstructive sleep apnea (adult) (pediatric): Secondary | ICD-10-CM | POA: Diagnosis not present

## 2021-08-24 DIAGNOSIS — M25552 Pain in left hip: Secondary | ICD-10-CM | POA: Diagnosis not present

## 2021-08-24 DIAGNOSIS — M170 Bilateral primary osteoarthritis of knee: Secondary | ICD-10-CM | POA: Diagnosis not present

## 2021-09-18 DIAGNOSIS — G4733 Obstructive sleep apnea (adult) (pediatric): Secondary | ICD-10-CM | POA: Diagnosis not present

## 2021-10-06 ENCOUNTER — Ambulatory Visit: Payer: BC Managed Care – PPO | Admitting: Endocrinology

## 2021-10-08 ENCOUNTER — Other Ambulatory Visit (INDEPENDENT_AMBULATORY_CARE_PROVIDER_SITE_OTHER): Payer: BC Managed Care – PPO

## 2021-10-08 ENCOUNTER — Other Ambulatory Visit: Payer: Self-pay | Admitting: Endocrinology

## 2021-10-08 DIAGNOSIS — E1122 Type 2 diabetes mellitus with diabetic chronic kidney disease: Secondary | ICD-10-CM | POA: Diagnosis not present

## 2021-10-08 DIAGNOSIS — N184 Chronic kidney disease, stage 4 (severe): Secondary | ICD-10-CM | POA: Diagnosis not present

## 2021-10-08 LAB — COMPREHENSIVE METABOLIC PANEL
ALT: 22 U/L (ref 0–35)
AST: 18 U/L (ref 0–37)
Albumin: 4.2 g/dL (ref 3.5–5.2)
Alkaline Phosphatase: 120 U/L — ABNORMAL HIGH (ref 39–117)
BUN: 25 mg/dL — ABNORMAL HIGH (ref 6–23)
CO2: 30 mEq/L (ref 19–32)
Calcium: 9.7 mg/dL (ref 8.4–10.5)
Chloride: 101 mEq/L (ref 96–112)
Creatinine, Ser: 1.27 mg/dL — ABNORMAL HIGH (ref 0.40–1.20)
GFR: 47.01 mL/min — ABNORMAL LOW (ref >60.00)
Glucose, Bld: 126 mg/dL — ABNORMAL HIGH (ref 70–99)
Potassium: 4.1 mEq/L (ref 3.5–5.1)
Sodium: 139 mEq/L (ref 135–145)
Total Bilirubin: 0.3 mg/dL (ref 0.2–1.2)
Total Protein: 7.4 g/dL (ref 6.0–8.3)

## 2021-10-08 LAB — LIPID PANEL
Cholesterol: 197 mg/dL (ref 0–200)
HDL: 58 mg/dL (ref >39.00)
NonHDL: 138.52
Total CHOL/HDL Ratio: 3
Triglycerides: 235 mg/dL — ABNORMAL HIGH (ref 0.0–149.0)
VLDL: 47 mg/dL — ABNORMAL HIGH (ref 0.0–40.0)

## 2021-10-08 LAB — MICROALBUMIN / CREATININE URINE RATIO
Creatinine,U: 34.8 mg/dL
Microalb Creat Ratio: 2 mg/g (ref 0.0–30.0)
Microalb, Ur: <0.7 mg/dL (ref 0.0–1.9)

## 2021-10-08 LAB — LDL CHOLESTEROL, DIRECT: Direct LDL: 118 mg/dL

## 2021-10-08 LAB — HEMOGLOBIN A1C: Hgb A1c MFr Bld: 8 % — ABNORMAL HIGH (ref 4.6–6.5)

## 2021-10-09 DIAGNOSIS — N6452 Nipple discharge: Secondary | ICD-10-CM | POA: Diagnosis not present

## 2021-10-11 ENCOUNTER — Ambulatory Visit: Payer: BC Managed Care – PPO | Admitting: Pulmonary Disease

## 2021-10-13 ENCOUNTER — Encounter: Payer: Self-pay | Admitting: Endocrinology

## 2021-10-13 ENCOUNTER — Ambulatory Visit (INDEPENDENT_AMBULATORY_CARE_PROVIDER_SITE_OTHER): Payer: BC Managed Care – PPO | Admitting: Endocrinology

## 2021-10-13 VITALS — BP 122/78 | HR 77 | Ht 66.5 in | Wt 325.6 lb

## 2021-10-13 DIAGNOSIS — E782 Mixed hyperlipidemia: Secondary | ICD-10-CM

## 2021-10-13 DIAGNOSIS — E1165 Type 2 diabetes mellitus with hyperglycemia: Secondary | ICD-10-CM

## 2021-10-13 DIAGNOSIS — I10 Essential (primary) hypertension: Secondary | ICD-10-CM

## 2021-10-13 MED ORDER — LOVASTATIN 40 MG PO TABS: 40.0000 mg | ORAL_TABLET | Freq: Every day | ORAL | 3 refills | Status: AC

## 2021-10-13 NOTE — Patient Instructions (Addendum)
Glimeperide at dinner  Check blood sugars on waking up 3 days a week  Also check blood sugars about 2 hours after meals and do this after different meals by rotation  Recommended blood sugar levels on waking up are 90-130 and about 2 hours after meal is 130-160  Please bring your blood sugar monitor to each visit, thank you  Water exercises

## 2021-10-13 NOTE — Progress Notes (Signed)
Patient ID: Laura Kennedy, female   DOB: 1965/05/05, 56 y.o.   MRN: [id number]           Reason for Appointment: Type II Diabetes follow-up   History of Present Illness   Diagnosis date: 2004  Previous history:  Non-insulin hypoglycemic drugs previously used: Rybelsus, metformin Apparently metformin was stopped because of renal dysfunction  A1c range in the last few years is: 5.8-8.0%  Recent history:     Non-insulin hypoglycemic drugs: Amaryl 2 mg in the morning, Ozempic 2 mg       Side effects from medications: None  Current self management, blood sugar patterns and problems identified:  A1c is 8 She has been on Ozempic since 03/2021 However she thinks that this has not helped her with her blood sugar control or weight although initially her A1c did come down to 6.5 She thinks she has been to the nutritionist at the PCP office but only recently is trying to make significant changes Her weight since 5/23 has gone up 18 pounds She generally has taken her Ozempic regularly and at the most may be a day late sometimes She does not want to change currently because of this picking up a 90-day prescription Currently not exercising because of knee pain She checks her blood sugars very infrequently and usually not after meals but does not remember recent readings Lab glucose was 126 midday, not fasting She thinks her blood sugars are overall higher recently because of having intercurrent illnesses such as pneumonia, UTI and mastitis      Hypoglycemia:  none now   Glucometer: One Touch.           Blood Glucose readings not available  Dietician visit: Most recent: With PCP office, probably in 5/23  Weight control:  Wt Readings from Last 3 Encounters:  10/13/21 (!) 325 lb 9.6 oz (147.7 kg)  08/06/21 (!) 307 lb 12.8 oz (139.6 kg)  06/23/21 298 lb 9.6 oz (135.4 kg)            Diabetes labs:  Lab Results  Component Value Date   HGBA1C 8.0 (H) 10/08/2021   HGBA1C 6.5 (A)  06/23/2021   HGBA1C 8.0 (A) 04/21/2021   Lab Results  Component Value Date   MICROALBUR <0.7 10/08/2021   CREATININE 1.27 (H) 10/08/2021    Lab Results  Component Value Date   FRUCTOSAMINE 271 08/17/2020     Allergies as of 10/13/2021       Reactions   Bee Venom Anaphylaxis   Fire Ant Anaphylaxis   Penicillins Itching, Other (See Comments)   Sulfa Antibiotics Hives, Other (See Comments)        Medication List        Accurate as of October 13, 2021  4:33 PM. If you have any questions, ask your nurse or doctor.          albuterol 108 (90 Base) MCG/ACT inhaler Commonly known as: VENTOLIN HFA Inhale 2 puffs into the lungs every 4 (four) hours as needed for wheezing or shortness of breath.   azelastine 0.1 % nasal spray Commonly known as: ASTELIN Place 1 spray into both nostrils 2 (two) times daily.   cetirizine 10 MG tablet Commonly known as: ZYRTEC 1 tablet   cyclobenzaprine 10 MG tablet Commonly known as: FLEXERIL Take 10 mg by mouth every 8 (eight) hours as needed.   esomeprazole 20 MG capsule Commonly known as: NEXIUM TAKE 1 CAPSULE BY MOUTH EVERY DAY FOR 30 DAYS   fluticasone  50 MCG/ACT nasal spray Commonly known as: FLONASE Place 1 spray into both nostrils daily.   furosemide 40 MG tablet Commonly known as: LASIX TAKE 1 TABLET BY MOUTH TWICE A DAY FOR 90 DAYS   glimepiride 2 MG tablet Commonly known as: AMARYL Take 1 tablet (2 mg total) by mouth daily before breakfast.   glucosamine-chondroitin 500-400 MG tablet Take 1 tablet by mouth.   HYDROcodone-acetaminophen 7.5-325 MG tablet Commonly known as: NORCO 1 tablet as needed   ipratropium-albuterol 0.5-2.5 (3) MG/3ML Soln Commonly known as: DUONEB Take 3 mLs by nebulization every 6 (six) hours as needed.   lovastatin 40 MG tablet Commonly known as: MEVACOR Take 1 tablet (40 mg total) by mouth at bedtime. What changed:  medication strength See the new instructions. Changed by: Elayne Snare, MD   metoprolol tartrate 50 MG tablet Commonly known as: LOPRESSOR TAKE 1 TABLET BY MOUTH TWICE A DAY WITH FOOD FOR 90 DAYS   montelukast 10 MG tablet Commonly known as: SINGULAIR Take 10 mg by mouth daily.   Ozempic (2 MG/DOSE) 8 MG/3ML Sopn Generic drug: Semaglutide (2 MG/DOSE) Inject 2 mg into the skin once a week.   Super Collagen Plus Vitamin C 1000-10 MG Tabs Generic drug: Collagen-Vitamin C Take 0.5 tablets by mouth daily.   Symbicort 160-4.5 MCG/ACT inhaler Generic drug: budesonide-formoterol Inhale 2 puffs into the lungs 2 (two) times daily.   valsartan 160 MG tablet Commonly known as: DIOVAN Take by mouth.        Allergies:  Allergies  Allergen Reactions   Bee Venom Anaphylaxis   Fire Ant Anaphylaxis   Penicillins Itching and Other (See Comments)   Sulfa Antibiotics Hives and Other (See Comments)    Past Medical History:  Diagnosis Date   Asthma    CKD (chronic kidney disease)    Diabetes mellitus ([place])    Hyperlipidemia    OSA (obstructive sleep apnea)     No past surgical history on file.  Family History  Problem Relation Age of Onset   Heart disease Mother    Clotting disorder Mother    COPD Father    Cancer Father    COPD Sister    Diabetes Neg Hx     Social History:  reports that she has quit smoking. Her smoking use included cigarettes. She has a 52.50 pack-year smoking history. She has never used smokeless tobacco. No history on file for alcohol use and drug use.  Review of Systems  Cardiovascular:  Positive for leg swelling.  Musculoskeletal:  Positive for joint pain.   UTI once or twice year  Last diabetic eye exam date 2/23  Last foot exam date: 5/22  Urine microalbumin: 7/23  Symptoms of neuropathy: None  Hypertension:   Treatment includes valsartan and metoprolol She is also on Lasix  BP Readings from Last 3 Encounters:  10/13/21 122/78  08/06/21 130/76  06/23/21 114/76    Lipid management: Managed by  PCP with 20 mg lovastatin but direct LDL now is 118, she has taken this regularly Triglycerides were high but labs were nonfasting    Lab Results  Component Value Date   CHOL 197 10/08/2021   Lab Results  Component Value Date   HDL 58.00 10/08/2021   No results found for: "[facility]" Lab Results  Component Value Date   TRIG 235.0 (H) 10/08/2021   Lab Results  Component Value Date   CHOLHDL 3 10/08/2021   Lab Results  Component Value Date   LDLDIRECT 118.0  10/08/2021     Examination:   BP 122/78 (BP Location: Left Arm, Patient Position: Sitting, Cuff Size: Normal)   Pulse 77   Ht 5' 6.5" (1.689 m)   Wt (!) 325 lb 9.6 oz (147.7 kg)   SpO2 98%   BMI 51.77 kg/m   Body mass index is 51.77 kg/m.    ASSESSMENT/ PLAN:    Diabetes type 2:   Current regimen: Ozempic and Amaryl  See history of present illness for detailed discussion of current diabetes management, blood sugar patterns and problems identified  A1c is 8%  Blood glucose control recently worse She thinks this is from intercurrent illnesses although difficult to know if she has a consistently abnormal blood sugar profile because of lack of monitoring and recent lab glucose only 126 She has had weight gain despite taking 2 mg Ozempic  Recommendations:  She does need to look into water exercises to help with her obesity Also she can continue to improve her day-to-day meal planning and follow instructions given by her nutritionist For now she will switch her glimepiride to suppertime as this may help her blood sugars after dinner and overnight and reduce possibility of low sugars during the day which she has had in the past She is a good candidate for a trial of MOUNJARO but she just filled her 19-month Ozempic prescription and will wait till her next visit to switch To monitor blood sugars consistently alternating fasting and about 2 hours after various meals and bring monitor for download on each visit  For  hyperlipidemia she will increase her LOVASTATIN to 40 mg since her direct LDL is 118  Continue to follow-up with PCP regarding hypertension when she appears well controlled and has no microalbuminuria  Patient Instructions  Glimeperide at dinner  Check blood sugars on waking up 3 days a week  Also check blood sugars about 2 hours after meals and do this after different meals by rotation  Recommended blood sugar levels on waking up are 90-130 and about 2 hours after meal is 130-160  Please bring your blood sugar monitor to each visit, thank you  Water exercises   Elayne Snare 10/13/2021, 4:33 PM

## 2021-10-18 DIAGNOSIS — G4733 Obstructive sleep apnea (adult) (pediatric): Secondary | ICD-10-CM | POA: Diagnosis not present

## 2021-10-20 DIAGNOSIS — D1801 Hemangioma of skin and subcutaneous tissue: Secondary | ICD-10-CM | POA: Diagnosis not present

## 2021-10-20 DIAGNOSIS — L821 Other seborrheic keratosis: Secondary | ICD-10-CM | POA: Diagnosis not present

## 2021-10-20 DIAGNOSIS — D692 Other nonthrombocytopenic purpura: Secondary | ICD-10-CM | POA: Diagnosis not present

## 2021-10-20 DIAGNOSIS — L814 Other melanin hyperpigmentation: Secondary | ICD-10-CM | POA: Diagnosis not present

## 2021-10-21 DIAGNOSIS — U071 COVID-19: Secondary | ICD-10-CM | POA: Diagnosis not present

## 2021-10-21 DIAGNOSIS — R059 Cough, unspecified: Secondary | ICD-10-CM | POA: Diagnosis not present

## 2021-10-26 ENCOUNTER — Telehealth: Payer: Self-pay | Admitting: Pulmonary Disease

## 2021-10-26 NOTE — Telephone Encounter (Signed)
Called patient and she states that she tested: Covid positive on Thursday of last week. Pt states that she does have cough, and congestion. Patient is wondering if she can do a chest xray to check on her lungs.

## 2021-10-28 NOTE — Telephone Encounter (Signed)
Called and spoke with pt letting her know the info per VS. Stated to pt that we did not have any openings with APP this week but next available was 8/9. Pt did still want to schedule a video visit so appt has been scheduled. Nothing further needed.

## 2021-10-28 NOTE — Telephone Encounter (Signed)
Please schedule video visit with one of the NPs this week to assess her symptom status and determine if additional testing is needed.

## 2021-11-03 ENCOUNTER — Encounter: Payer: Self-pay | Admitting: Nurse Practitioner

## 2021-11-03 ENCOUNTER — Telehealth (INDEPENDENT_AMBULATORY_CARE_PROVIDER_SITE_OTHER): Payer: BC Managed Care – PPO | Admitting: Nurse Practitioner

## 2021-11-03 DIAGNOSIS — U071 COVID-19: Secondary | ICD-10-CM

## 2021-11-03 DIAGNOSIS — J4540 Moderate persistent asthma, uncomplicated: Secondary | ICD-10-CM | POA: Diagnosis not present

## 2021-11-03 DIAGNOSIS — J45909 Unspecified asthma, uncomplicated: Secondary | ICD-10-CM | POA: Insufficient documentation

## 2021-11-03 NOTE — Progress Notes (Signed)
Patient ID: Laura Kennedy, female     DOB: 11-15-1965, 56 y.o.      MRN: [id number]  Chief Complaint  Patient presents with   Follow-up    She is testing negative as of today, she had cough that has some clear to pale yellow cough, she denies body aches, chills or fever, she has some fatigue,    Virtual Visit via Video Note  I connected with Laura Kennedy on 11/03/21 at  9:30 AM EDT by a video enabled telemedicine application and verified that I am speaking with the correct person using two identifiers.  Location: Patient: Home Provider: Office   I discussed the limitations of evaluation and management by telemedicine and the availability of in person appointments. The patient expressed understanding and agreed to proceed.  History of Present Illness: 56 year old female, former smoker followed for asthma and OSA on CPAP. She is a patient of Dr. Juanetta Gosling and last seen in office 08/06/2021. Past medical history significant for HTN, DM II, obesity, HLD.   08/06/2021: OV with Dr. Halford Chessman. Diagnosed with sleep apnea years ago in [place]; doesn't have a DME in [place] to get new supplies. Referred to local DME for CPAP supplies. History of asthma and allergies; bad reaction to cats. Had to be treated for exacerbation in April with z pak and prednisone. Doing better since. Still has some sinus congestion with post nasal drip, which triggers a cough. Continue Symbicort and singulair; if symptoms progress, add LAMA. Add irrigation, flonase, astepro.   11/03/2021: Today - follow up Patient presents today via virtual visit for follow up after contracting COVID. She tested positive around the 27th of July. Initially had some low grade fevers but these have resolved. She has been feeling better but still has some fatigue and a cough. Cough is occasionally productive, primarily with clear sputum. Every now and then will get a scant amount of yellow sputum. Breathing is stable when compared to her baseline. She occasionally has  some wheezing, which is normal for her and resolves with inhaler use. Hasn't had to use her rescue inhaler or neb. Denies any more fevers, increased shortness of breath, night sweats, anorexia, hemoptysis. She is eating and drinking well. Sats have been >90 the entire time. Overall, slowly getting better. She is concerned about scarring on her lungs from having COVID and curious if a chest x [name] would evaluate for this.   Allergies  Allergen Reactions   Bee Venom Anaphylaxis   Fire Ant Anaphylaxis   Sulfa Antibiotics Hives and Other (See Comments)   Penicillins Itching and Other (See Comments)   Immunization History  Administered Date(s) Administered   PFIZER(Purple Top)SARS-COV-2 Vaccination 03/30/2020, 10/26/2020   Pfizer Covid-19 Vaccine Bivalent Booster 20yrs & up 01/14/2021   Past Medical History:  Diagnosis Date   Asthma    CKD (chronic kidney disease)    Diabetes mellitus (HCC)    Hyperlipidemia    OSA (obstructive sleep apnea)     Tobacco History: Social History   Tobacco Use  Smoking Status Former   Packs/day: 3.50   Years: 15.00   Total pack years: 52.50   Types: Cigarettes  Smokeless Tobacco Never   Counseling given: Not Answered   Outpatient Medications Prior to Visit  Medication Sig Dispense Refill   albuterol (VENTOLIN HFA) 108 (90 Base) MCG/ACT inhaler Inhale 2 puffs into the lungs every 4 (four) hours as needed for wheezing or shortness of breath. 18 g 3   azelastine (ASTELIN) 0.1 %  nasal spray Place 1 spray into both nostrils 2 (two) times daily. 30 mL 12   cetirizine (ZYRTEC) 10 MG tablet 1 tablet     Collagen-Vitamin C (SUPER COLLAGEN PLUS VITAMIN C) 1000-10 MG TABS Take 0.5 tablets by mouth daily.     cyclobenzaprine (FLEXERIL) 10 MG tablet Take 10 mg by mouth every 8 (eight) hours as needed.     esomeprazole (NEXIUM) 20 MG capsule TAKE 1 CAPSULE BY MOUTH EVERY DAY FOR 30 DAYS     fluticasone (FLONASE) 50 MCG/ACT nasal spray Place 1 spray into both  nostrils daily. 16 g 2   furosemide (LASIX) 40 MG tablet TAKE 1 TABLET BY MOUTH TWICE A DAY FOR 90 DAYS     glimepiride (AMARYL) 2 MG tablet Take 1 tablet (2 mg total) by mouth daily before breakfast. 90 tablet 3   glucosamine-chondroitin 500-400 MG tablet Take 1 tablet by mouth.     HYDROcodone-acetaminophen (NORCO) 7.5-325 MG tablet 1 tablet as needed     ipratropium-albuterol (DUONEB) 0.5-2.5 (3) MG/3ML SOLN Take 3 mLs by nebulization every 6 (six) hours as needed. 360 mL 5   lovastatin (MEVACOR) 40 MG tablet Take 1 tablet (40 mg total) by mouth at bedtime. 90 tablet 3   metoprolol tartrate (LOPRESSOR) 50 MG tablet TAKE 1 TABLET BY MOUTH TWICE A DAY WITH FOOD FOR 90 DAYS     montelukast (SINGULAIR) 10 MG tablet Take 10 mg by mouth daily.     Semaglutide, 2 MG/DOSE, (OZEMPIC, 2 MG/DOSE,) 8 MG/3ML SOPN Inject 2 mg into the skin once a week. 9 mL 3   SYMBICORT 160-4.5 MCG/ACT inhaler Inhale 2 puffs into the lungs 2 (two) times daily.     valsartan (DIOVAN) 160 MG tablet Take by mouth.     doxycycline (VIBRAMYCIN) 100 MG capsule Take 100 mg by mouth 2 (two) times daily.     PAXLOVID, 300/100, 20 x 150 MG & 10 x 100MG  TBPK Take 1 tablet by mouth as directed.     No facility-administered medications prior to visit.     Review of Systems:   Constitutional: No weight loss or gain, night sweats, fevers, chills,  or lassitude. +fatigue HEENT: No headaches, difficulty swallowing, tooth/dental problems, or sore throat. No sneezing, itching, ear ache, nasal congestion, or post nasal drip CV:  No chest pain, orthopnea, PND, swelling in lower extremities, anasarca, dizziness, palpitations, syncope Resp: +shortness of breath with exertion (baseline); minimally productive cough; occasional wheeze. No excess mucus or change in color of mucus. No hemoptysis.  No chest wall deformity GI:  No heartburn, indigestion, abdominal pain, nausea, vomiting, diarrhea, change in bowel habits, loss of appetite, bloody  stools.  Skin: No rash, lesions, ulcerations MSK:  No joint pain or swelling.  No decreased range of motion.  No back pain. Neuro: No dizziness or lightheadedness.  Psych: No depression or anxiety. Mood stable.   Observations/Objective: Patient is well-developed, well-nourished in no acute distress. A&Ox3. Resting comfortably at home. Unlabored, regular breathing. Speech is clear and coherent with logical content.    Assessment and Plan: COVID-19 virus infection Positive end of July. Appears to be recovering well. Concerned about scarring on her lungs post COVID; discussed that given her improvement and no change in her breathing, would not recommend imaging at this time. Advised that if she doesn't continue to improve or develops increased shortness of breath, purulent sputum, or recurrence of fevers, to notify us so we can obtain CXR. Continue supportive care measures in interim.  Activity encouraged.   Patient Instructions  Continue Albuterol inhaler 2 puffs or duoneb 3 mL every 6 hours as needed for shortness of breath or wheezing. Notify if symptoms persist despite rescue inhaler/neb use. Continue Symbicort 2 puffs Twice daily. Brush tongue and rinse mouth afterwards Continue zyrtec 1 tab daily for allergies Continue flonase nasal spray 2 sprays each nostril daily Continue astelin 1 spray each nostril Twice daily  Continue singulair 10 mg At bedtime   Mucinex 600 mg Twice daily as needed for chest congestion/cough Delsym 2 tsp Twice daily as needed for cough  Activity as tolerated   Follow up in three months with Dr. Halford Chessman or Katie Maxten Shuler,NP. If symptoms do not improve or worsen, please contact office for sooner follow up or seek emergency care.     Asthma Compensated on current regimen. Appears to be stable without recent exacerbation, despite COVID infection. Continue Symbicort and prn albuterol/duoneb. Continue singulair for trigger prevention. Advised her to notify if breathing,  cough or wheezing worsen.     I discussed the assessment and treatment plan with the patient. The patient was provided an opportunity to ask questions and all were answered. The patient agreed with the plan and demonstrated an understanding of the instructions.   The patient was advised to call back or seek an in-person evaluation if the symptoms worsen or if the condition fails to improve as anticipated.  I provided 25 minutes of non-face-to-face time during this encounter.   Clayton Bibles, NP

## 2021-11-03 NOTE — Progress Notes (Signed)
Reviewed and agree with assessment/plan.   Chesley Mires, MD [facility] Pulmonary/Critical Care 11/03/2021, 11:11 AM Pager:  [phone]

## 2021-11-03 NOTE — Assessment & Plan Note (Addendum)
Positive end of July. Appears to be recovering well. Concerned about scarring on her lungs post COVID; discussed that given her improvement and no change in her breathing, would not recommend imaging at this time. Advised that if she doesn't continue to improve or develops increased shortness of breath, purulent sputum, or recurrence of fevers, to notify us so we can obtain CXR. Continue supportive care measures in interim. Activity encouraged.   Patient Instructions  Continue Albuterol inhaler 2 puffs or duoneb 3 mL every 6 hours as needed for shortness of breath or wheezing. Notify if symptoms persist despite rescue inhaler/neb use. Continue Symbicort 2 puffs Twice daily. Brush tongue and rinse mouth afterwards Continue zyrtec 1 tab daily for allergies Continue flonase nasal spray 2 sprays each nostril daily Continue astelin 1 spray each nostril Twice daily  Continue singulair 10 mg At bedtime   Mucinex 600 mg Twice daily as needed for chest congestion/cough Delsym 2 tsp Twice daily as needed for cough  Activity as tolerated   Follow up in three months with Dr. Halford Chessman or Katie Valerio Pinard,NP. If symptoms do not improve or worsen, please contact office for sooner follow up or seek emergency care.

## 2021-11-03 NOTE — Assessment & Plan Note (Signed)
Compensated on current regimen. Appears to be stable without recent exacerbation, despite COVID infection. Continue Symbicort and prn albuterol/duoneb. Continue singulair for trigger prevention. Advised her to notify if breathing, cough or wheezing worsen.

## 2021-11-03 NOTE — Patient Instructions (Signed)
Continue Albuterol inhaler 2 puffs or duoneb 3 mL every 6 hours as needed for shortness of breath or wheezing. Notify if symptoms persist despite rescue inhaler/neb use. Continue Symbicort 2 puffs Twice daily. Brush tongue and rinse mouth afterwards Continue zyrtec 1 tab daily for allergies Continue flonase nasal spray 2 sprays each nostril daily Continue astelin 1 spray each nostril Twice daily  Continue singulair 10 mg At bedtime   Mucinex 600 mg Twice daily as needed for chest congestion/cough Delsym 2 tsp Twice daily as needed for cough  Activity as tolerated   Follow up in three months with Dr. Halford Chessman or Katie Roanne Haye,NP. If symptoms do not improve or worsen, please contact office for sooner follow up or seek emergency care.

## 2021-11-05 DIAGNOSIS — N6452 Nipple discharge: Secondary | ICD-10-CM | POA: Diagnosis not present

## 2021-11-25 DIAGNOSIS — M25562 Pain in left knee: Secondary | ICD-10-CM | POA: Diagnosis not present

## 2021-11-25 DIAGNOSIS — M25561 Pain in right knee: Secondary | ICD-10-CM | POA: Diagnosis not present

## 2021-11-26 ENCOUNTER — Ambulatory Visit (INDEPENDENT_AMBULATORY_CARE_PROVIDER_SITE_OTHER): Payer: BC Managed Care – PPO | Admitting: Pulmonary Disease

## 2021-11-26 DIAGNOSIS — J4550 Severe persistent asthma, uncomplicated: Secondary | ICD-10-CM

## 2021-11-26 LAB — PULMONARY FUNCTION TEST
DL/VA % pred: 136 %
DL/VA: 5.66 ml/min/mmHg/L
DLCO cor % pred: 111 %
DLCO cor: 24.79 ml/min/mmHg
DLCO unc % pred: 111 %
DLCO unc: 24.79 ml/min/mmHg
FEF 25-75 Post: 2.59 L/sec
FEF 25-75 Pre: 2.7 L/sec
FEF2575-%Change-Post: -4 %
FEF2575-%Pred-Post: 98 %
FEF2575-%Pred-Pre: 102 %
FEV1-%Change-Post: -2 %
FEV1-%Pred-Post: 79 %
FEV1-%Pred-Pre: 81 %
FEV1-Post: 2.28 L
FEV1-Pre: 2.35 L
FEV1FVC-%Change-Post: 1 %
FEV1FVC-%Pred-Pre: 108 %
FEV6-%Change-Post: -5 %
FEV6-%Pred-Post: 73 %
FEV6-%Pred-Pre: 76 %
FEV6-Post: 2.61 L
FEV6-Pre: 2.75 L
FEV6FVC-%Pred-Post: 103 %
FEV6FVC-%Pred-Pre: 103 %
FVC-%Change-Post: -4 %
FVC-%Pred-Post: 70 %
FVC-%Pred-Pre: 74 %
FVC-Post: 2.62 L
FVC-Pre: 2.75 L
Post FEV1/FVC ratio: 87 %
Post FEV6/FVC ratio: 100 %
Pre FEV1/FVC ratio: 85 %
Pre FEV6/FVC Ratio: 100 %
RV % pred: 51 %
RV: 1.06 L
TLC % pred: 75 %
TLC: 4.1 L

## 2021-11-26 NOTE — Patient Instructions (Signed)
Full PFT performed today. °

## 2021-11-26 NOTE — Progress Notes (Signed)
Full PFT performed today. °

## 2021-11-30 ENCOUNTER — Encounter: Payer: Self-pay | Admitting: Endocrinology

## 2021-12-06 ENCOUNTER — Encounter: Payer: Self-pay | Admitting: Pulmonary Disease

## 2021-12-06 ENCOUNTER — Ambulatory Visit (INDEPENDENT_AMBULATORY_CARE_PROVIDER_SITE_OTHER): Payer: BC Managed Care – PPO | Admitting: Pulmonary Disease

## 2021-12-06 VITALS — BP 118/60 | HR 85 | Ht 66.5 in | Wt 321.0 lb

## 2021-12-06 DIAGNOSIS — G4733 Obstructive sleep apnea (adult) (pediatric): Secondary | ICD-10-CM

## 2021-12-06 DIAGNOSIS — J3081 Allergic rhinitis due to animal (cat) (dog) hair and dander: Secondary | ICD-10-CM

## 2021-12-06 DIAGNOSIS — J4550 Severe persistent asthma, uncomplicated: Secondary | ICD-10-CM | POA: Diagnosis not present

## 2021-12-06 DIAGNOSIS — G4730 Sleep apnea, unspecified: Secondary | ICD-10-CM | POA: Diagnosis not present

## 2021-12-06 DIAGNOSIS — E669 Obesity, unspecified: Secondary | ICD-10-CM

## 2021-12-06 NOTE — Progress Notes (Signed)
[facility], [facility]  Chief Complaint  Patient presents with   Follow-up    asthma    Past Surgical History:  She  has no past surgical history on file.  Past Medical History:  DM type 2, GERD, HLD, HTN, CKD  Constitutional:  BP 118/60 (BP Location: Left Arm, Cuff Size: Large)   Pulse 85   Ht 5' 6.5" (1.689 m)   Wt (!) 321 lb (145.6 kg)   SpO2 97%   BMI 51.03 kg/m   Brief Summary:  Laura Kennedy is a 56 y.o. female with obstructive sleep apnea and allergic asthma.  She works as a Merchandiser, retail.      Subjective:   She had COVID in July.  Better now.  Not having much cough.  Not having sinus congestion, or wheeze.  PFT showed mild restriction.  Symbicort and singulair help.  Uses albuterol intermittently.  No issues with CPAP set up.  She is planning to move back to [place] in March 2024.  Physical Exam:   Appearance - well kempt   ENMT - no sinus tenderness, no oral exudate, no LAN, Mallampati 4 airway, no stridor  Respiratory - equal breath sounds bilaterally, no wheezing or rales  CV - s1s2 regular rate and rhythm, no murmurs  Ext - no clubbing, no edema  Skin - no rashes  Psych - normal mood and affect    Pulmonary testing:  PFT 11/26/21 >> FEV1 2.35 (81%), FEV1% 85, TLC 4.10 (75%), DLCO 111%  Chest Imaging:    Sleep Tests:  Auto CPAP 07/06/21 to 08/04/21 >> used on 30 of 30 nights with average 6 hrs 14 min.  Average AHI 1.3 with median CPAP 6 and 95 th percentile CPAP 8 cm H2O  Social History:  She  reports that she has quit smoking. Her smoking use included cigarettes. She has a 52.50 pack-year smoking history. She has never used smokeless tobacco.  Family History:  Her family history includes COPD in her father and sister; Cancer in her father; Clotting disorder in her mother; Heart disease in her mother.     Assessment/Plan:   Obstructive sleep apnea. - she is compliant with CPAP and reports benefit from  therapy - continue auto CPAP 5 to 15 cm H2O  Allergic asthma. - continue symbicort, singulair - prn albuterol HFA, duoneb - if symptoms progress, then add LAMA  Allergic rhinitis. - add nasal irrigation, flonase, astepro - continue singulair, zyrtec  Obesity. - remains on ozempic  Time Spent Involved in Patient Care on Day of Examination:  27 minutes  Follow up:   Patient Instructions  Follow up in 4 to 5 months  Medication List:   Allergies as of 12/06/2021       Reactions   Bee Venom Anaphylaxis   Fire Ant Anaphylaxis   Sulfa Antibiotics Hives, Other (See Comments)   Penicillins Itching, Other (See Comments)        Medication List        Accurate as of December 06, 2021 12:35 PM. If you have any questions, ask your nurse or doctor.          STOP taking these medications    fluticasone 50 MCG/ACT nasal spray Commonly known as: FLONASE Stopped by: Chesley Mires, MD       TAKE these medications    albuterol 108 (90 Base) MCG/ACT inhaler Commonly known as: VENTOLIN HFA Inhale 2 puffs into the lungs every 4 (four) hours as  needed for wheezing or shortness of breath.   azelastine 0.1 % nasal spray Commonly known as: ASTELIN Place 1 spray into both nostrils 2 (two) times daily.   cetirizine 10 MG tablet Commonly known as: ZYRTEC 1 tablet   cyclobenzaprine 10 MG tablet Commonly known as: FLEXERIL Take 10 mg by mouth every 8 (eight) hours as needed.   esomeprazole 20 MG capsule Commonly known as: NEXIUM TAKE 1 CAPSULE BY MOUTH EVERY DAY FOR 30 DAYS   furosemide 40 MG tablet Commonly known as: LASIX TAKE 1 TABLET BY MOUTH TWICE A DAY FOR 90 DAYS   glimepiride 2 MG tablet Commonly known as: AMARYL Take 1 tablet (2 mg total) by mouth daily before breakfast.   glucosamine-chondroitin 500-400 MG tablet Take 1 tablet by mouth.   HYDROcodone-acetaminophen 7.5-325 MG tablet Commonly known as: NORCO 1 tablet as needed   ipratropium-albuterol  0.5-2.5 (3) MG/3ML Soln Commonly known as: DUONEB Take 3 mLs by nebulization every 6 (six) hours as needed.   lovastatin 40 MG tablet Commonly known as: MEVACOR Take 1 tablet (40 mg total) by mouth at bedtime.   metoprolol tartrate 50 MG tablet Commonly known as: LOPRESSOR TAKE 1 TABLET BY MOUTH TWICE A DAY WITH FOOD FOR 90 DAYS   montelukast 10 MG tablet Commonly known as: SINGULAIR Take 10 mg by mouth daily.   multivitamin tablet Take 1 tablet by mouth daily.   Ozempic (2 MG/DOSE) 8 MG/3ML Sopn Generic drug: Semaglutide (2 MG/DOSE) Inject 2 mg into the skin once a week.   Super Collagen Plus Vitamin C 1000-10 MG Tabs Generic drug: Collagen-Vitamin C Take 0.5 tablets by mouth daily.   Symbicort 160-4.5 MCG/ACT inhaler Generic drug: budesonide-formoterol Inhale 2 puffs into the lungs 2 (two) times daily.   valsartan 160 MG tablet Commonly known as: DIOVAN Take by mouth.        Signature:  Chesley Mires, MD [place] Pager - [phone] 12/06/2021, 12:35 PM

## 2021-12-06 NOTE — Patient Instructions (Signed)
Follow up in 4 to 5 months 

## 2021-12-20 DIAGNOSIS — G4733 Obstructive sleep apnea (adult) (pediatric): Secondary | ICD-10-CM | POA: Diagnosis not present

## 2021-12-21 DIAGNOSIS — M170 Bilateral primary osteoarthritis of knee: Secondary | ICD-10-CM | POA: Diagnosis not present

## 2021-12-31 ENCOUNTER — Other Ambulatory Visit (INDEPENDENT_AMBULATORY_CARE_PROVIDER_SITE_OTHER): Payer: BC Managed Care – PPO

## 2021-12-31 DIAGNOSIS — E782 Mixed hyperlipidemia: Secondary | ICD-10-CM | POA: Diagnosis not present

## 2021-12-31 DIAGNOSIS — E1165 Type 2 diabetes mellitus with hyperglycemia: Secondary | ICD-10-CM

## 2021-12-31 LAB — COMPREHENSIVE METABOLIC PANEL
ALT: 25 U/L (ref 0–35)
AST: 22 U/L (ref 0–37)
Albumin: 3.9 g/dL (ref 3.5–5.2)
Alkaline Phosphatase: 101 U/L (ref 39–117)
BUN: 21 mg/dL (ref 6–23)
CO2: 29 mEq/L (ref 19–32)
Calcium: 9.3 mg/dL (ref 8.4–10.5)
Chloride: 102 mEq/L (ref 96–112)
Creatinine, Ser: 1.43 mg/dL — ABNORMAL HIGH (ref 0.40–1.20)
GFR: 40.71 mL/min — ABNORMAL LOW (ref >60.00)
Glucose, Bld: 142 mg/dL — ABNORMAL HIGH (ref 70–99)
Potassium: 3.9 mEq/L (ref 3.5–5.1)
Sodium: 139 mEq/L (ref 135–145)
Total Bilirubin: 0.3 mg/dL (ref 0.2–1.2)
Total Protein: 7 g/dL (ref 6.0–8.3)

## 2021-12-31 LAB — LIPID PANEL
Cholesterol: 158 mg/dL (ref 0–200)
HDL: 52.5 mg/dL (ref >39.00)
LDL Cholesterol: 78 mg/dL (ref 0–99)
NonHDL: 105.32
Total CHOL/HDL Ratio: 3
Triglycerides: 138 mg/dL (ref 0.0–149.0)
VLDL: 27.6 mg/dL (ref 0.0–40.0)

## 2021-12-31 LAB — HEMOGLOBIN A1C: Hgb A1c MFr Bld: 7.6 % — ABNORMAL HIGH (ref 4.6–6.5)

## 2022-01-04 NOTE — Progress Notes (Unsigned)
Patient ID: Laura Kennedy, female   DOB: 1965/11/29, 56 y.o.   MRN: [id number]           Reason for Appointment: Type II Diabetes follow-up   History of Present Illness   Diagnosis date: 2004  Previous history:  Non-insulin hypoglycemic drugs previously used: Rybelsus, metformin Apparently metformin was stopped because of renal dysfunction  A1c range in the last few years is: 5.8-8.0%  Recent history:     Non-insulin hypoglycemic drugs: Amaryl 2 mg in the pm, Ozempic 2 mg       Side effects from medications: None  Current self management, blood sugar patterns and problems identified:  A1c is 7.6 She has been out of her Ozempic for at least 2 weeks now Since she had not benefited significantly from [place] she was supposed to switch to [facility] but has not She still thinks her blood sugars are as high as 190 after meals and 140 fasting Did not bring her monitor She is not able to do much exercise and has not lost any weight Lab glucose was 142 likely after breakfast Recently without Ozempic her blood sugars are up to 220 and she is taking Amaryl twice a day, previously at dinnertime She thinks that she had better blood sugars with Actos but she was told by nephrologist that it will damage her kidneys Unclear why she did not take metformin recently      Hypoglycemia:  none   Glucometer: One Touch.           Blood Glucose readings as above, monitor not downloaded  Dietician visit: Most recent: With PCP office, probably in 5/23  Weight control:  Wt Readings from Last 3 Encounters:  01/05/22 (!) 322 lb 6.4 oz (146.2 kg)  12/06/21 (!) 321 lb (145.6 kg)  10/13/21 (!) 325 lb 9.6 oz (147.7 kg)            Diabetes labs:  Lab Results  Component Value Date   HGBA1C 7.6 (H) 12/31/2021   HGBA1C 8.0 (H) 10/08/2021   HGBA1C 6.5 (A) 06/23/2021   Lab Results  Component Value Date   MICROALBUR <0.7 10/08/2021   LDLCALC 78 12/31/2021   CREATININE 1.43 (H) 12/31/2021    Lab  Results  Component Value Date   FRUCTOSAMINE 271 08/17/2020     Allergies as of 01/05/2022       Reactions   Bee Venom Anaphylaxis   Fire Ant Anaphylaxis   Sulfa Antibiotics Hives, Other (See Comments)   Penicillins Itching, Other (See Comments)        Medication List        Accurate as of January 05, 2022  8:42 PM. If you have any questions, ask your nurse or doctor.          STOP taking these medications    Ozempic (2 MG/DOSE) 8 MG/3ML Sopn Generic drug: Semaglutide (2 MG/DOSE) Stopped by: Elayne Snare, MD       TAKE these medications    albuterol 108 (90 Base) MCG/ACT inhaler Commonly known as: VENTOLIN HFA Inhale 2 puffs into the lungs every 4 (four) hours as needed for wheezing or shortness of breath.   azelastine 0.1 % nasal spray Commonly known as: ASTELIN Place 1 spray into both nostrils 2 (two) times daily.   cetirizine 10 MG tablet Commonly known as: ZYRTEC 1 tablet   cyclobenzaprine 10 MG tablet Commonly known as: FLEXERIL Take 10 mg by mouth every 8 (eight) hours as needed.   esomeprazole 20  MG capsule Commonly known as: NEXIUM TAKE 1 CAPSULE BY MOUTH EVERY DAY FOR 30 DAYS   furosemide 40 MG tablet Commonly known as: LASIX TAKE 1 TABLET BY MOUTH TWICE A DAY FOR 90 DAYS   glimepiride 2 MG tablet Commonly known as: AMARYL Take 1 tablet (2 mg total) by mouth daily before breakfast.   glucosamine-chondroitin 500-400 MG tablet Take 1 tablet by mouth.   HYDROcodone-acetaminophen 7.5-325 MG tablet Commonly known as: NORCO 1 tablet as needed   ipratropium-albuterol 0.5-2.5 (3) MG/3ML Soln Commonly known as: DUONEB Take 3 mLs by nebulization every 6 (six) hours as needed.   lovastatin 40 MG tablet Commonly known as: MEVACOR Take 1 tablet (40 mg total) by mouth at bedtime.   metoprolol tartrate 50 MG tablet Commonly known as: LOPRESSOR TAKE 1 TABLET BY MOUTH TWICE A DAY WITH FOOD FOR 90 DAYS   montelukast 10 MG tablet Commonly  known as: SINGULAIR Take 10 mg by mouth daily.   multivitamin tablet Take 1 tablet by mouth daily.   pioglitazone 15 MG tablet Commonly known as: Actos Take 1 tablet (15 mg total) by mouth daily. Started by: Elayne Snare, MD   Super Collagen Plus Vitamin C 1000-10 MG Tabs Generic drug: Collagen-Vitamin C Take 0.5 tablets by mouth daily.   Symbicort 160-4.5 MCG/ACT inhaler Generic drug: budesonide-formoterol Inhale 2 puffs into the lungs 2 (two) times daily.   tirzepatide 5 MG/0.5ML Pen Commonly known as: MOUNJARO Inject 5 mg into the skin once a week. Started by: Elayne Snare, MD   tirzepatide 10 MG/0.5ML Pen Commonly known as: MOUNJARO Inject 10 mg into the skin once a week. Started by: Elayne Snare, MD   valsartan 160 MG tablet Commonly known as: DIOVAN Take by mouth.        Allergies:  Allergies  Allergen Reactions   Bee Venom Anaphylaxis   Fire Ant Anaphylaxis   Sulfa Antibiotics Hives and Other (See Comments)   Penicillins Itching and Other (See Comments)    Past Medical History:  Diagnosis Date   Asthma    CKD (chronic kidney disease)    Diabetes mellitus ([place])    Hyperlipidemia    OSA (obstructive sleep apnea)     No past surgical history on file.  Family History  Problem Relation Age of Onset   Heart disease Mother    Clotting disorder Mother    COPD Father    Cancer Father    COPD Sister    Diabetes Neg Hx     Social History:  reports that she has quit smoking. Her smoking use included cigarettes. She has a 52.50 pack-year smoking history. She has never used smokeless tobacco. No history on file for alcohol use and drug use.  ROS   Last diabetic eye exam date 2/23  Last foot exam date: 5/22  Urine microalbumin: 7/23  Symptoms of neuropathy: None  Hypertension:   Treatment includes valsartan and metoprolol She is also on Lasix   BP Readings from Last 3 Encounters:  01/05/22 126/70  12/06/21 118/60  10/13/21 122/78   Renal  function is slightly worse and she thinks her blood pressure at home is relatively low  Lab Results  Component Value Date   CREATININE 1.43 (H) 12/31/2021   CREATININE 1.27 (H) 10/08/2021   CREATININE 1.23 (H) 08/17/2020     Lipid management: Managed by PCP with 20 mg lovastatin but now lipids are better with 40 mg  Also triglycerides improved     Lab Results  Component Value Date   CHOL 158 12/31/2021   CHOL 197 10/08/2021   Lab Results  Component Value Date   HDL 52.50 12/31/2021   HDL 58.00 10/08/2021   Lab Results  Component Value Date   LDLCALC 78 12/31/2021   Lab Results  Component Value Date   TRIG 138.0 12/31/2021   TRIG 235.0 (H) 10/08/2021   Lab Results  Component Value Date   CHOLHDL 3 12/31/2021   CHOLHDL 3 10/08/2021   Lab Results  Component Value Date   LDLDIRECT 118.0 10/08/2021   Unable to calculate Fibrosis 4 Score. Requires ALT, AST, and platelet count within the last 6 months.  FATTY liver: This is present on her MRI of the abdomen   Examination:   BP 126/70   Pulse 67   Ht 5' 6.5" (1.689 m)   Wt (!) 322 lb 6.4 oz (146.2 kg)   SpO2 96%   BMI 51.26 kg/m   Body mass index is 51.26 kg/m.    ASSESSMENT/ PLAN:    Diabetes type 2:   Current regimen: Ozempic and Amaryl  See history of present illness for detailed discussion of current diabetes management, blood sugar patterns and problems identified  A1c is 7.6  Blood glucose control is not optimal   She has had no weight loss despite taking 2 mg Ozempic  Recommendations:  She will switch to Mounjaro 5 mg weekly for 4 weeks and given also a prescription for 10 mg to start after the first month Once blood sugars are better she can stop morning Amaryl Trial of 15 mg Actos since she felt it was improving and also discussed that she likely has significant fatty liver which has not been improved unless she has marked weight loss Discussed that Actos is safe on kidney function Needs  to do more exercise Consider follow-up with nutritionist Needs to bring her monitor for download on each visit  For hyperlipidemia she will continue her LOVASTATIN to 40 mg since her LDL is at target  Continue to follow-up with PCP regarding hypertension but for now we will need to reduce her valsartan to half tablet because of relatively low blood pressure and higher creatinine  Platelet count to be checked on the next visit to evaluate liver fibrosis score  Patient Instructions  Check blood sugars on waking up days a week  Also check blood sugars about 2 hours after meals and do this after different meals by rotation  Recommended blood sugar levels on waking up are 90-130 and about 2 hours after meal is 130-160  Please bring your blood sugar monitor to each visit, thank you  Valsartan 1/2 daily  Mounjaro weekly 5mg  x4 then 10mg    Elayne Snare 01/05/2022, 8:42 PM

## 2022-01-05 ENCOUNTER — Ambulatory Visit (INDEPENDENT_AMBULATORY_CARE_PROVIDER_SITE_OTHER): Payer: BC Managed Care – PPO | Admitting: Endocrinology

## 2022-01-05 ENCOUNTER — Encounter: Payer: Self-pay | Admitting: Endocrinology

## 2022-01-05 VITALS — BP 126/70 | HR 67 | Ht 66.5 in | Wt 322.4 lb

## 2022-01-05 DIAGNOSIS — I10 Essential (primary) hypertension: Secondary | ICD-10-CM

## 2022-01-05 DIAGNOSIS — E782 Mixed hyperlipidemia: Secondary | ICD-10-CM

## 2022-01-05 DIAGNOSIS — E1165 Type 2 diabetes mellitus with hyperglycemia: Secondary | ICD-10-CM | POA: Diagnosis not present

## 2022-01-05 MED ORDER — TIRZEPATIDE 5 MG/0.5ML ~~LOC~~ SOAJ: 5.0000 mg | SUBCUTANEOUS | 0 refills | Status: DC

## 2022-01-05 MED ORDER — TIRZEPATIDE 10 MG/0.5ML ~~LOC~~ SOAJ: 10.0000 mg | SUBCUTANEOUS | 1 refills | Status: DC

## 2022-01-05 MED ORDER — PIOGLITAZONE HCL 15 MG PO TABS: 15.0000 mg | ORAL_TABLET | Freq: Every day | ORAL | 2 refills | Status: DC

## 2022-01-05 NOTE — Patient Instructions (Addendum)
Check blood sugars on waking up days a week  Also check blood sugars about 2 hours after meals and do this after different meals by rotation  Recommended blood sugar levels on waking up are 90-130 and about 2 hours after meal is 130-160  Please bring your blood sugar monitor to each visit, thank you  Valsartan 1/2 daily  Mounjaro weekly 5mg  x4 then 10mg 

## 2022-01-19 DIAGNOSIS — G4733 Obstructive sleep apnea (adult) (pediatric): Secondary | ICD-10-CM | POA: Diagnosis not present

## 2022-01-30 ENCOUNTER — Other Ambulatory Visit: Payer: Self-pay | Admitting: Endocrinology

## 2022-01-31 ENCOUNTER — Other Ambulatory Visit: Payer: Self-pay

## 2022-01-31 DIAGNOSIS — E1165 Type 2 diabetes mellitus with hyperglycemia: Secondary | ICD-10-CM

## 2022-01-31 MED ORDER — TIRZEPATIDE 10 MG/0.5ML ~~LOC~~ SOAJ: 10.0000 mg | SUBCUTANEOUS | 1 refills | Status: DC

## 2022-02-19 DIAGNOSIS — G4733 Obstructive sleep apnea (adult) (pediatric): Secondary | ICD-10-CM | POA: Diagnosis not present

## 2022-02-24 DIAGNOSIS — I10 Essential (primary) hypertension: Secondary | ICD-10-CM | POA: Diagnosis not present

## 2022-02-24 DIAGNOSIS — Z23 Encounter for immunization: Secondary | ICD-10-CM | POA: Diagnosis not present

## 2022-02-24 DIAGNOSIS — E785 Hyperlipidemia, unspecified: Secondary | ICD-10-CM | POA: Diagnosis not present

## 2022-02-24 DIAGNOSIS — E1165 Type 2 diabetes mellitus with hyperglycemia: Secondary | ICD-10-CM | POA: Diagnosis not present

## 2022-02-24 DIAGNOSIS — G8929 Other chronic pain: Secondary | ICD-10-CM | POA: Diagnosis not present

## 2022-03-09 DIAGNOSIS — M170 Bilateral primary osteoarthritis of knee: Secondary | ICD-10-CM | POA: Diagnosis not present

## 2022-03-23 ENCOUNTER — Other Ambulatory Visit: Payer: Self-pay | Admitting: Endocrinology

## 2022-04-12 ENCOUNTER — Other Ambulatory Visit: Payer: Self-pay | Admitting: Endocrinology

## 2022-04-25 ENCOUNTER — Other Ambulatory Visit: Payer: Self-pay | Admitting: Endocrinology

## 2022-04-25 ENCOUNTER — Other Ambulatory Visit: Payer: BC Managed Care – PPO

## 2022-04-25 DIAGNOSIS — E1165 Type 2 diabetes mellitus with hyperglycemia: Secondary | ICD-10-CM

## 2022-04-26 ENCOUNTER — Other Ambulatory Visit (INDEPENDENT_AMBULATORY_CARE_PROVIDER_SITE_OTHER): Payer: BC Managed Care – PPO

## 2022-04-26 DIAGNOSIS — E782 Mixed hyperlipidemia: Secondary | ICD-10-CM

## 2022-04-26 DIAGNOSIS — E1165 Type 2 diabetes mellitus with hyperglycemia: Secondary | ICD-10-CM | POA: Diagnosis not present

## 2022-04-26 DIAGNOSIS — I10 Essential (primary) hypertension: Secondary | ICD-10-CM | POA: Diagnosis not present

## 2022-04-26 LAB — BASIC METABOLIC PANEL
BUN: 16 mg/dL (ref 6–23)
CO2: 29 mEq/L (ref 19–32)
Calcium: 9.1 mg/dL (ref 8.4–10.5)
Chloride: 101 mEq/L (ref 96–112)
Creatinine, Ser: 1.12 mg/dL (ref 0.40–1.20)
GFR: 54.46 mL/min — ABNORMAL LOW (ref >60.00)
Glucose, Bld: 85 mg/dL (ref 70–99)
Potassium: 3.6 mEq/L (ref 3.5–5.1)
Sodium: 140 mEq/L (ref 135–145)

## 2022-04-26 LAB — CBC
HCT: 41.5 % (ref 36.0–46.0)
Hemoglobin: 13.6 g/dL (ref 12.0–15.0)
MCHC: 32.9 g/dL (ref 30.0–36.0)
MCV: 78.5 fl (ref 78.0–100.0)
Platelets: 305 10*3/uL (ref 150.0–400.0)
RBC: 5.29 Mil/uL — ABNORMAL HIGH (ref 3.87–5.11)
RDW: 17.5 % — ABNORMAL HIGH (ref 11.5–15.5)
WBC: 9.1 10*3/uL (ref 4.0–10.5)

## 2022-04-26 LAB — HEMOGLOBIN A1C: Hgb A1c MFr Bld: 5.9 % (ref 4.6–6.5)

## 2022-04-28 ENCOUNTER — Other Ambulatory Visit: Payer: Self-pay | Admitting: Endocrinology

## 2022-04-29 ENCOUNTER — Ambulatory Visit (INDEPENDENT_AMBULATORY_CARE_PROVIDER_SITE_OTHER): Payer: BC Managed Care – PPO | Admitting: Endocrinology

## 2022-04-29 VITALS — BP 130/70 | HR 104 | Ht 66.5 in | Wt 341.8 lb

## 2022-04-29 DIAGNOSIS — E1165 Type 2 diabetes mellitus with hyperglycemia: Secondary | ICD-10-CM

## 2022-04-29 DIAGNOSIS — I10 Essential (primary) hypertension: Secondary | ICD-10-CM

## 2022-04-29 DIAGNOSIS — N1831 Chronic kidney disease, stage 3a: Secondary | ICD-10-CM

## 2022-04-29 DIAGNOSIS — N189 Chronic kidney disease, unspecified: Secondary | ICD-10-CM | POA: Diagnosis not present

## 2022-04-29 DIAGNOSIS — E1122 Type 2 diabetes mellitus with diabetic chronic kidney disease: Secondary | ICD-10-CM | POA: Diagnosis not present

## 2022-04-29 LAB — POCT GLUCOSE (DEVICE FOR HOME USE): POC Glucose: 156 mg/dl — AB (ref 70–99)

## 2022-04-29 MED ORDER — TOPIRAMATE 25 MG PO TABS: 25.0000 mg | ORAL_TABLET | Freq: Two times a day (BID) | ORAL | 2 refills | Status: DC

## 2022-04-29 MED ORDER — PHENTERMINE HCL 15 MG PO CAPS: 15.0000 mg | ORAL_CAPSULE | ORAL | 2 refills | Status: DC

## 2022-04-29 NOTE — Patient Instructions (Signed)
Check blood sugars on waking up days a week  Also check blood sugars about 2 hours after meals and do this after different meals by rotation  Recommended blood sugar levels on waking up are 90-130 and about 2 hours after meal is 130-160  Please bring your blood sugar monitor to each visit, thank you  Try stopping Glimeperide

## 2022-04-29 NOTE — Progress Notes (Signed)
Patient ID: Laura Kennedy, female   DOB: [birth year], 57 y.o.   MRN: [id number]           Reason for Appointment: Type II Diabetes follow-up   History of Present Illness   Diagnosis date: 2004  Previous history:  Non-insulin hypoglycemic drugs previously used: Rybelsus, metformin Apparently metformin was stopped because of renal dysfunction  A1c range in the last few years is: 5.8-8.0%  Recent history:     Non-insulin hypoglycemic drugs: Amaryl 2 mg in the pm, Actos 15mg  qd     Side effects from medications: None  Current self management, blood sugar patterns and problems identified:  A1c is 5.9 compared to 7.6  She believes that her sugars are better with adding Actos which has worked before However she was switched from [facility] to [place] in 10/23 and now is taking 10 mg without side effect She does not think that Mounjaro helps improve her satiety Since her last visit she has gained 19 pounds which is more than usual Did not bring her monitor again for download She is not able to do much exercise She reports fairly good blood sugars most of the time She thinks her only when she is eating sweets that her blood sugar will go up over 200 Lab glucose was 85 fasting has not lost any weight Continues to take Amaryl at night without hypoglycemic symptoms She refuses to take metformin because she thinks it affected her kidneys      Hypoglycemia:  none   Glucometer: One Touch.           Blood Glucose readings  Fasting 80-110 <150 usually, monitor not downloaded  Dietician visit: Most recent: With PCP office, probably in 5/23  Weight control:  Wt Readings from Last 3 Encounters:  04/29/22 (!) 341 lb 12.8 oz (155 kg)  01/05/22 (!) 322 lb 6.4 oz (146.2 kg)  12/06/21 (!) 321 lb (145.6 kg)            Diabetes labs:  Lab Results  Component Value Date   HGBA1C 5.9 04/26/2022   HGBA1C 7.6 (H) 12/31/2021   HGBA1C 8.0 (H) 10/08/2021   Lab Results  Component Value Date    MICROALBUR <0.7 10/08/2021   LDLCALC 78 12/31/2021   CREATININE 1.12 04/26/2022    Lab Results  Component Value Date   FRUCTOSAMINE 271 08/17/2020     Allergies as of 04/29/2022       Reactions   Bee Venom Anaphylaxis   Fire Ant Anaphylaxis   Sulfa Antibiotics Hives, Other (See Comments)   Penicillins Itching, Other (See Comments)        Medication List        Accurate as of April 29, 2022 11:46 AM. If you have any questions, ask your nurse or doctor.          albuterol 108 (90 Base) MCG/ACT inhaler Commonly known as: VENTOLIN HFA Inhale 2 puffs into the lungs every 4 (four) hours as needed for wheezing or shortness of breath.   azelastine 0.1 % nasal spray Commonly known as: ASTELIN Place 1 spray into both nostrils 2 (two) times daily.   cetirizine 10 MG tablet Commonly known as: ZYRTEC 1 tablet   cyclobenzaprine 10 MG tablet Commonly known as: FLEXERIL Take 10 mg by mouth every 8 (eight) hours as needed.   esomeprazole 20 MG capsule Commonly known as: NEXIUM TAKE 1 CAPSULE BY MOUTH EVERY DAY FOR 30 DAYS   furosemide 40 MG tablet Commonly known as:  LASIX TAKE 1 TABLET BY MOUTH TWICE A DAY FOR 90 DAYS   glimepiride 2 MG tablet Commonly known as: AMARYL Take 1 tablet (2 mg total) by mouth daily before breakfast.   glucosamine-chondroitin 500-400 MG tablet Take 1 tablet by mouth.   HYDROcodone-acetaminophen 7.5-325 MG tablet Commonly known as: NORCO 1 tablet as needed   ipratropium-albuterol 0.5-2.5 (3) MG/3ML Soln Commonly known as: DUONEB Take 3 mLs by nebulization every 6 (six) hours as needed.   lovastatin 40 MG tablet Commonly known as: MEVACOR Take 1 tablet (40 mg total) by mouth at bedtime.   metoprolol tartrate 50 MG tablet Commonly known as: LOPRESSOR TAKE 1 TABLET BY MOUTH TWICE A DAY WITH FOOD FOR 90 DAYS   montelukast 10 MG tablet Commonly known as: SINGULAIR Take 10 mg by mouth daily.   multivitamin tablet Take 1 tablet  by mouth daily.   pioglitazone 15 MG tablet Commonly known as: ACTOS TAKE 1 TABLET (15 MG TOTAL) BY MOUTH DAILY.   Super Collagen Plus Vitamin C 1000-10 MG Tabs Generic drug: Collagen-Vitamin C Take 0.5 tablets by mouth daily.   Symbicort 160-4.5 MCG/ACT inhaler Generic drug: budesonide-formoterol Inhale 2 puffs into the lungs 2 (two) times daily.   tirzepatide 10 MG/0.5ML Pen Commonly known as: MOUNJARO Inject 10 mg into the skin once a week.   valsartan 160 MG tablet Commonly known as: DIOVAN Take by mouth.        Allergies:  Allergies  Allergen Reactions   Bee Venom Anaphylaxis   Fire Ant Anaphylaxis   Sulfa Antibiotics Hives and Other (See Comments)   Penicillins Itching and Other (See Comments)    Past Medical History:  Diagnosis Date   Asthma    CKD (chronic kidney disease)    Diabetes mellitus ([place])    Hyperlipidemia    OSA (obstructive sleep apnea)     No past surgical history on file.  Family History  Problem Relation Age of Onset   Heart disease Mother    Clotting disorder Mother    COPD Father    Cancer Father    COPD Sister    Diabetes Neg Hx     Social History:  reports that she has quit smoking. Her smoking use included cigarettes. She has a 52.50 pack-year smoking history. She has never used smokeless tobacco. No history on file for alcohol use and drug use.  ROS   Last diabetic eye exam date 2/23  Last foot exam date: 5/22  Urine microalbumin: 7/23  Symptoms of neuropathy: None  Hypertension:   Treatment includes valsartan and metoprolol She is also on Lasix   BP Readings from Last 3 Encounters:  04/29/22 130/70  01/05/22 126/70  12/06/21 118/60   Renal function is better, followed by nephrologist  Lab Results  Component Value Date   CREATININE 1.12 04/26/2022   CREATININE 1.43 (H) 12/31/2021   CREATININE 1.27 (H) 10/08/2021     Lipid management: Managed by PCP with 40 mg  Has mixed hyperlipidemia     Lab  Results  Component Value Date   CHOL 158 12/31/2021   CHOL 197 10/08/2021   Lab Results  Component Value Date   HDL 52.50 12/31/2021   HDL 58.00 10/08/2021   Lab Results  Component Value Date   LDLCALC 78 12/31/2021   Lab Results  Component Value Date   TRIG 138.0 12/31/2021   TRIG 235.0 (H) 10/08/2021   Lab Results  Component Value Date   CHOLHDL 3 12/31/2021  CHOLHDL 3 10/08/2021   Lab Results  Component Value Date   LDLDIRECT 118.0 10/08/2021   Unable to calculate Fibrosis 4 Score. Requires ALT, AST, and platelet count within the last 6 months.  FATTY liver: This is present on her MRI of the abdomen   Examination:   BP 130/70 (BP Location: Left Arm, Patient Position: Sitting, Cuff Size: Normal)   Pulse (!) 104   Ht 5' 6.5" (1.689 m)   Wt (!) 341 lb 12.8 oz (155 kg)   SpO2 96%   BMI 54.34 kg/m   Body mass index is 54.34 kg/m.    ASSESSMENT/ PLAN:    Diabetes type 2:   Current regimen: Ozempic and Amaryl  See history of present illness for detailed discussion of current diabetes management, blood sugar patterns and problems identified  A1c is 5.9 compared to 7.6  Blood glucose control is much improved both with switching Ozempic to [facility] and taking this regularly as well as adding Actos Blood sugars may be low normal fasting and before meals although low hypoglycemia with Amaryl She has difficulty controlling her diet and has gained nearly 20 pounds  Recommendations:  She will continue Mounjaro 10 mg daily now She will stop her Amaryl and see if her blood sugars stay consistently She will try to work on her diet further Although she may have gained weight on Actos she refuses to stop this as she thinks it helps her sugar and also is benefiting her hepatic steatosis likely Needs to do more exercise Consider appointment with nutritionist Needs to bring her monitor for download on each visit Despite explaining the safety of metformin with her renal  function being normal and she refuses to consider this  For her obesity she we will try phentermine 15 mg daily with Topamax 25 mg twice daily, explained how this works This should be safe since she has previously taken higher doses of phentermine without side effect Follow-up in 3 months  Renal dysfunction: Improved with reducing valsartan   There are no Patient Instructions on file for this visit.   Elayne Snare 04/29/2022, 11:46 AM

## 2022-06-04 ENCOUNTER — Other Ambulatory Visit: Payer: Self-pay | Admitting: Endocrinology

## 2022-06-28 ENCOUNTER — Other Ambulatory Visit: Payer: Self-pay

## 2022-07-15 ENCOUNTER — Other Ambulatory Visit: Payer: Self-pay

## 2022-07-15 DIAGNOSIS — E1165 Type 2 diabetes mellitus with hyperglycemia: Secondary | ICD-10-CM

## 2022-07-15 MED ORDER — GLIMEPIRIDE 2 MG PO TABS: 2.0000 mg | ORAL_TABLET | Freq: Every day | ORAL | 3 refills | Status: AC

## 2022-07-18 ENCOUNTER — Ambulatory Visit (HOSPITAL_BASED_OUTPATIENT_CLINIC_OR_DEPARTMENT_OTHER): Payer: BC Managed Care – PPO | Admitting: Pulmonary Disease

## 2022-07-24 ENCOUNTER — Other Ambulatory Visit: Payer: Self-pay | Admitting: Endocrinology

## 2022-07-24 DIAGNOSIS — E1165 Type 2 diabetes mellitus with hyperglycemia: Secondary | ICD-10-CM

## 2022-08-14 ENCOUNTER — Telehealth: Payer: Self-pay

## 2022-08-14 NOTE — Telephone Encounter (Signed)
Patient Advocate Encounter   Received notification from [facility] that prior authorization is required for [facility]  Submitted: 08/13/22 Key [id number]   Status is pending

## 2022-08-19 ENCOUNTER — Other Ambulatory Visit: Payer: Self-pay | Admitting: Endocrinology

## 2022-08-20 NOTE — Telephone Encounter (Signed)
Patient Advocate Encounter   Error on PA. Resubmitted 08/20/22  Key [id number]  Status is APPROVED   Effective dates: 08/20/22 through 08/10/2023

## 2022-09-14 ENCOUNTER — Other Ambulatory Visit: Payer: Self-pay | Admitting: Endocrinology

## 2022-09-21 ENCOUNTER — Other Ambulatory Visit: Payer: Self-pay | Admitting: Endocrinology

## 2023-04-21 IMAGING — CR DG CHEST 2V
2 series · 2 of 2 positions shown · non-contrast
Comparison: None.

CLINICAL DATA: TB screening.

EXAM:
CHEST - 2 VIEW

[w chest pa *]
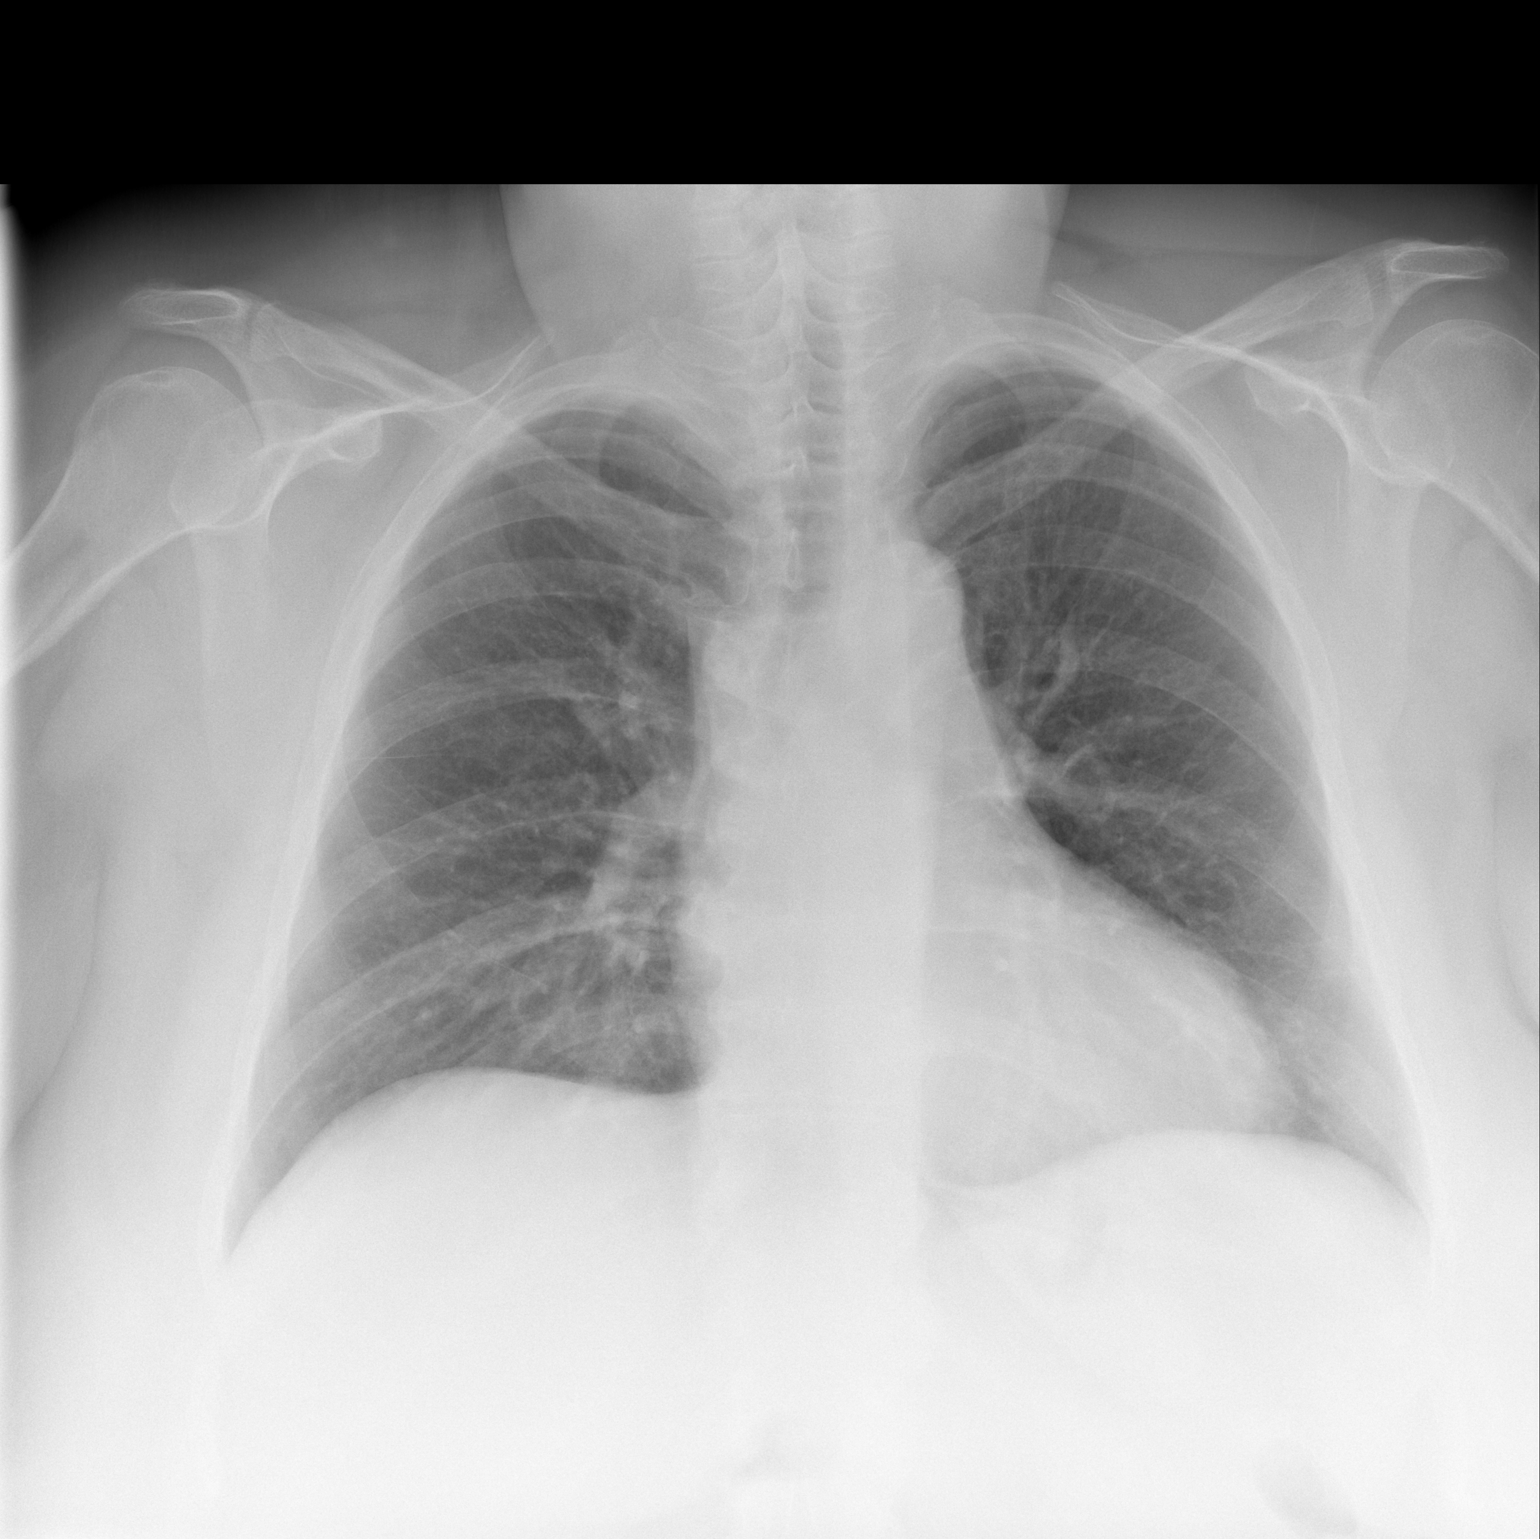

[w chest lat *]
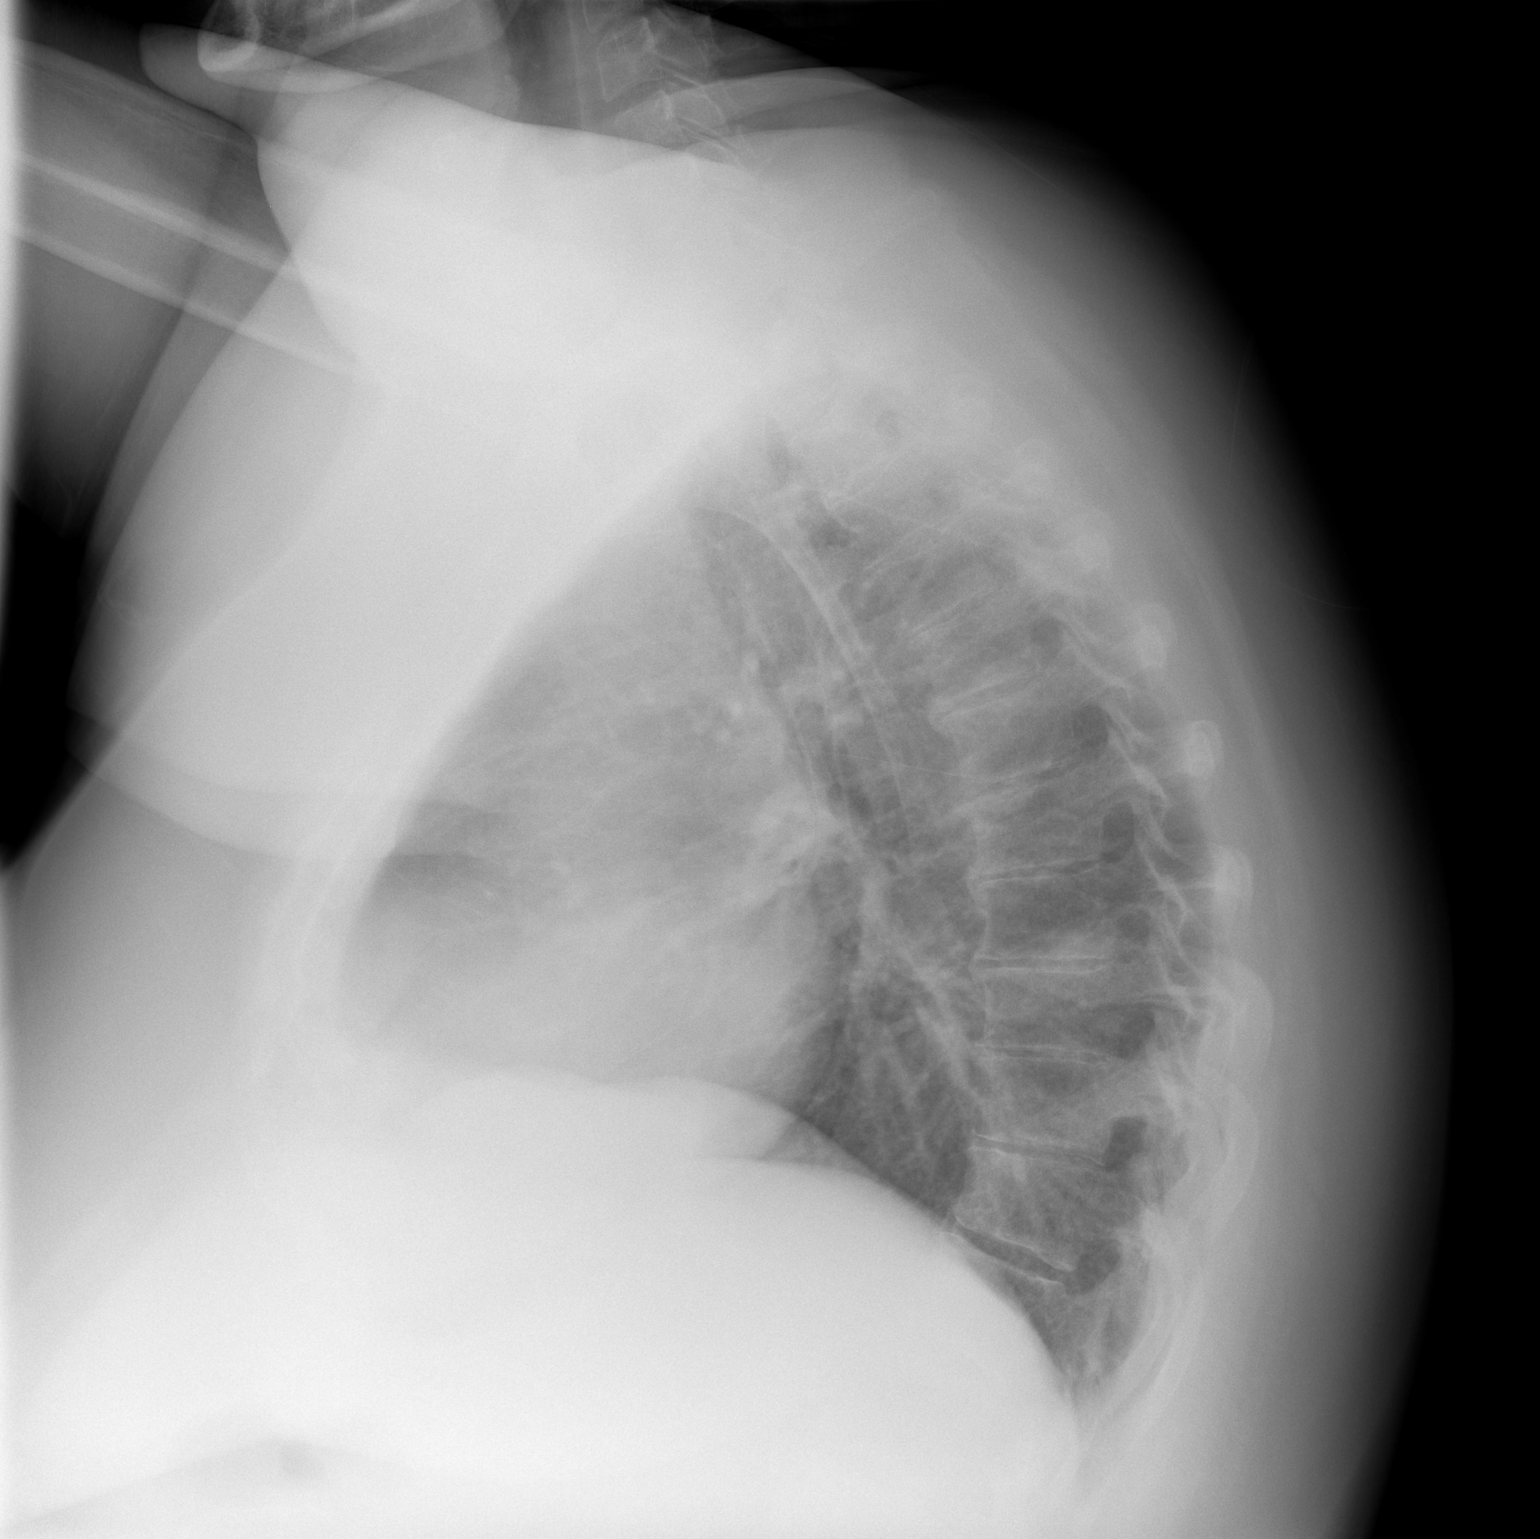

[2 of 2 positions shown; findings below may reference images not displayed]

FINDINGS: Heart size and mediastinal contours are within normal limits. Lungs
are clear. No pleural effusion. Osseous structures about the chest
are unremarkable.
IMPRESSION: No active cardiopulmonary disease.  No radiographic evidence of TB.

## 2023-07-29 IMAGING — DX DG CHEST 2V
2 series · 2 of 2 positions shown · non-contrast
Comparison: 04/29/2021

CLINICAL DATA: Asthma.

EXAM:
CHEST - 2 VIEW

[chest pa]
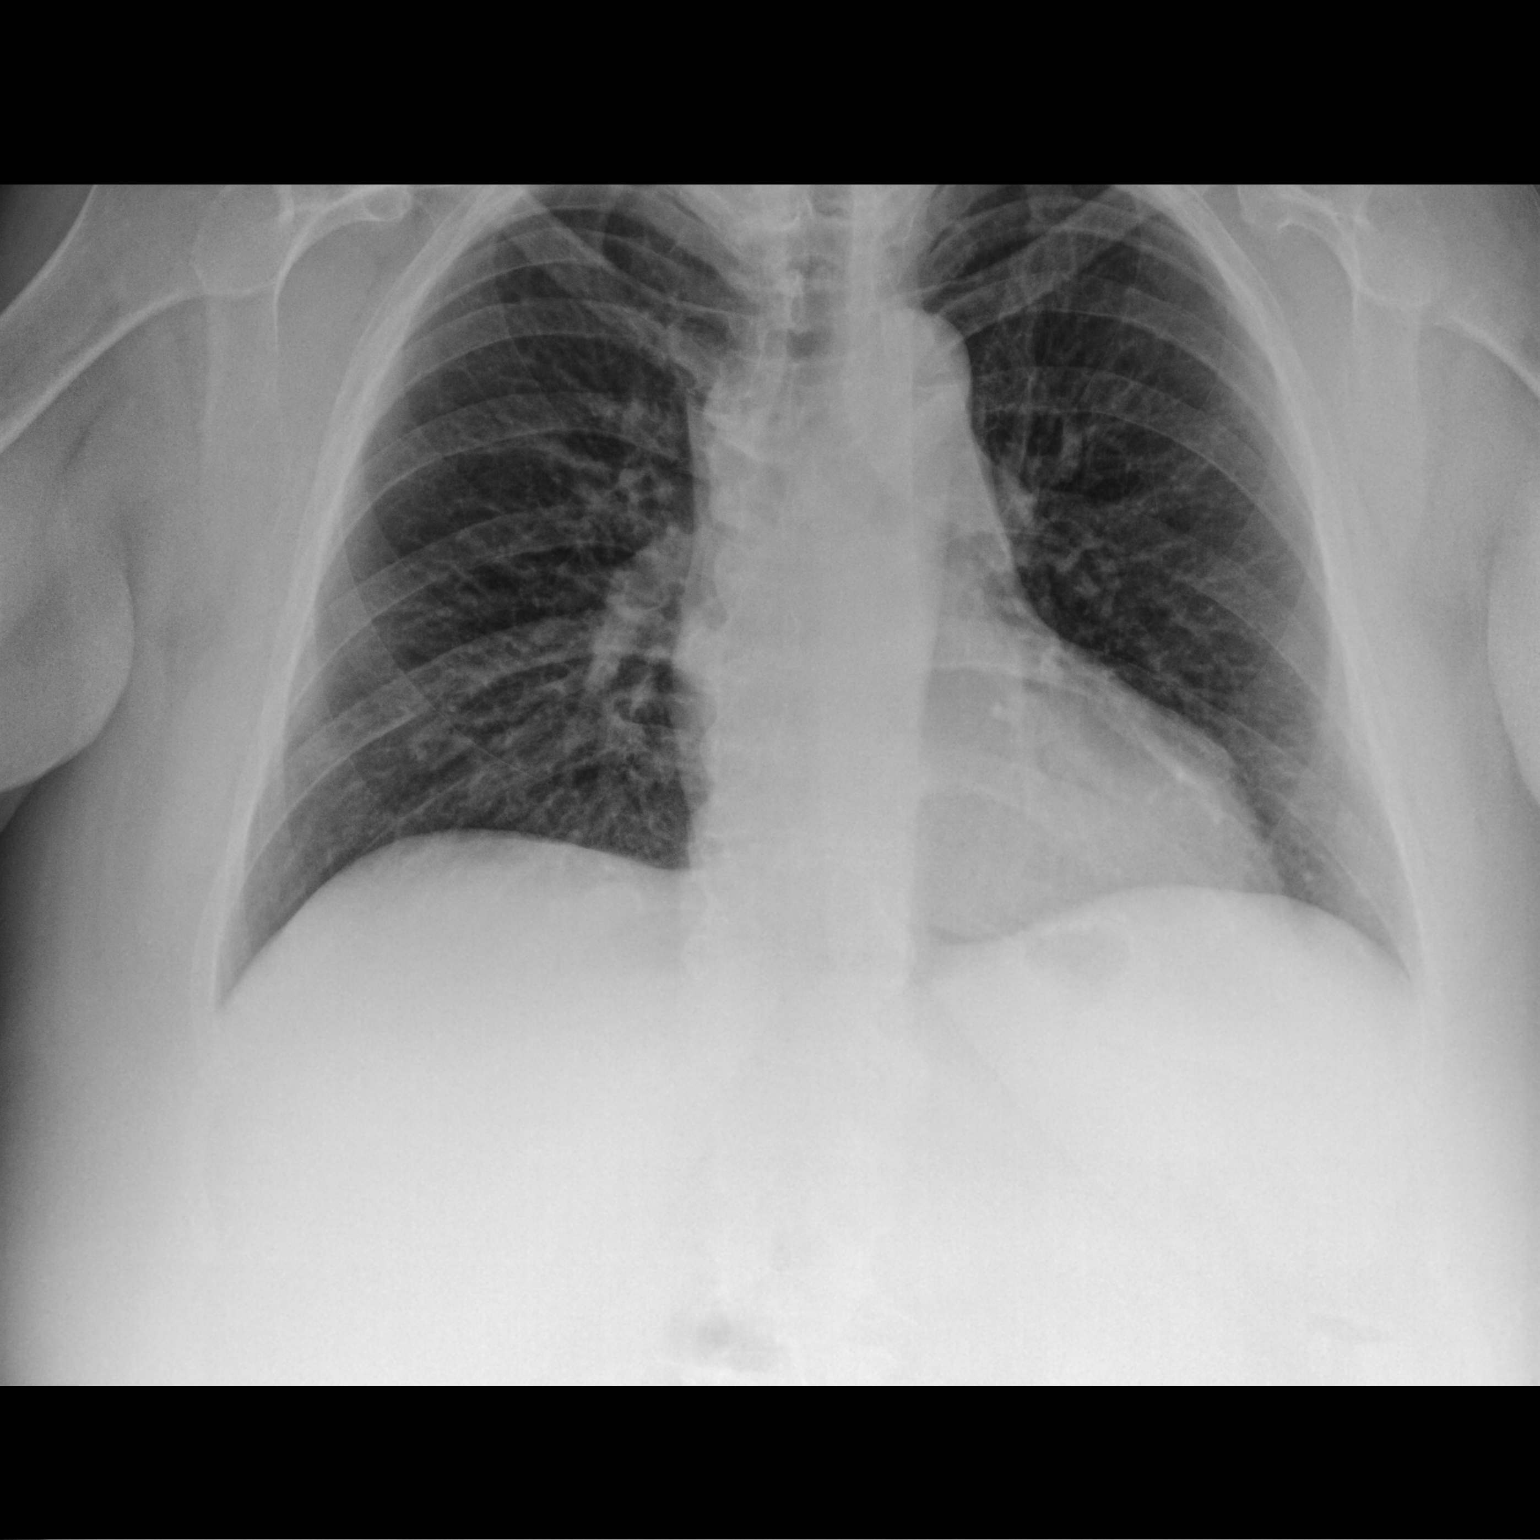

[chest lat]
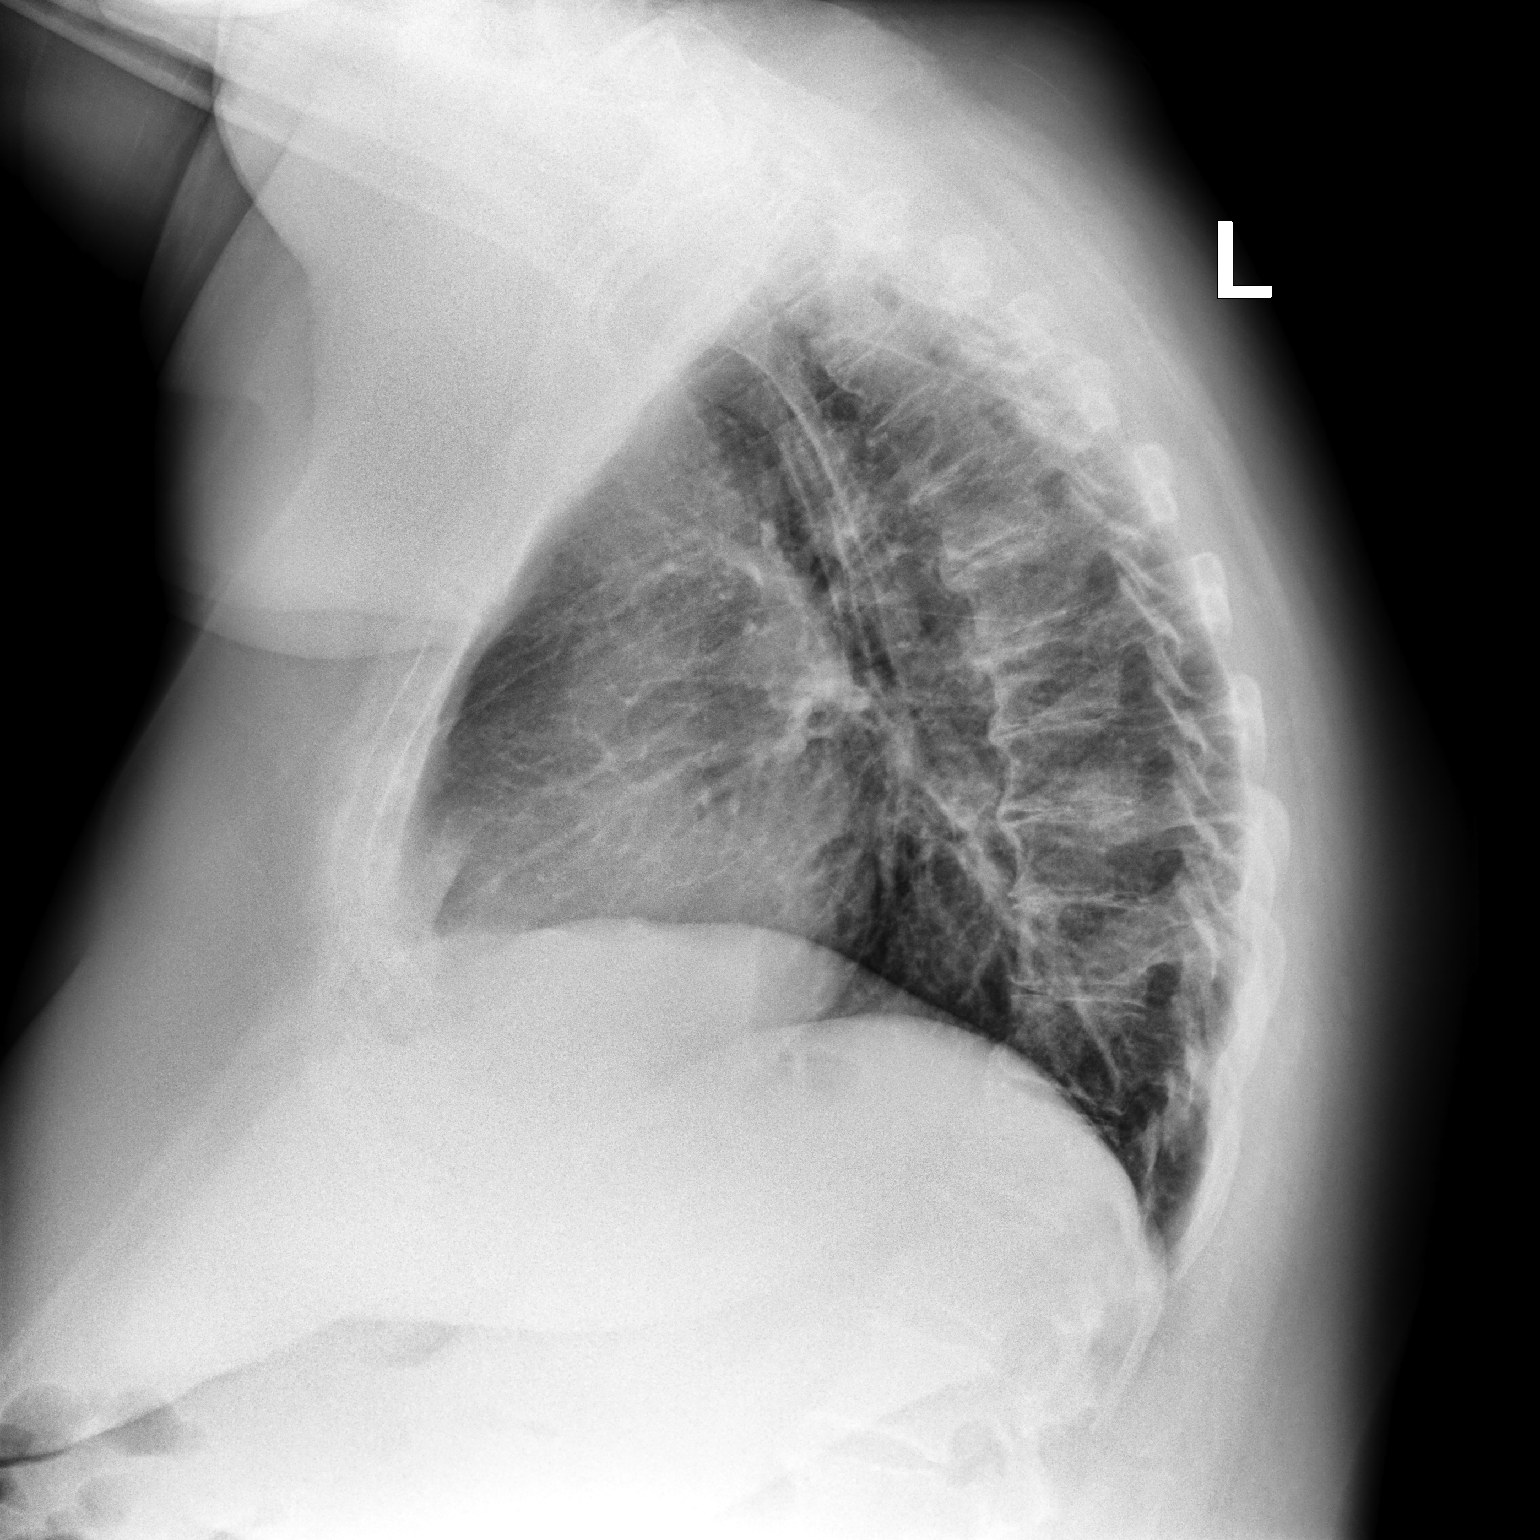

[2 of 2 positions shown; findings below may reference images not displayed]

FINDINGS: The heart size and mediastinal contours are within normal limits.
Both lungs are clear. No evidence of pulmonary hyperinflation or
pleural effusion. The visualized skeletal structures are
unremarkable.
IMPRESSION: No active cardiopulmonary disease.
